# Patient Record
Sex: Female | Born: 1995 | Hispanic: Yes | Marital: Single | Smoking: Never smoker
Health system: Southern US, Community
[De-identification: ages and names within clinical notes are randomized; demographics above are authoritative.]

## PROBLEM LIST (undated history)

## (undated) ENCOUNTER — Inpatient Hospital Stay (HOSPITAL_COMMUNITY): Payer: Self-pay

## (undated) DIAGNOSIS — Z789 Other specified health status: Secondary | ICD-10-CM

## (undated) HISTORY — DX: Other specified health status: Z78.9

## (undated) HISTORY — PX: NO PAST SURGERIES: SHX2092

---

## 2016-03-23 ENCOUNTER — Encounter: Payer: Self-pay | Admitting: Family Medicine

## 2016-03-23 ENCOUNTER — Ambulatory Visit (INDEPENDENT_AMBULATORY_CARE_PROVIDER_SITE_OTHER): Payer: Medicaid Other | Admitting: *Deleted

## 2016-03-23 DIAGNOSIS — Z331 Pregnant state, incidental: Secondary | ICD-10-CM | POA: Diagnosis not present

## 2016-03-23 DIAGNOSIS — Z3490 Encounter for supervision of normal pregnancy, unspecified, unspecified trimester: Secondary | ICD-10-CM

## 2016-03-23 LAB — POCT PREGNANCY, URINE: PREG TEST UR: POSITIVE — AB

## 2016-03-23 LAB — HCG, QUANTITATIVE, PREGNANCY: HCG, BETA CHAIN, QUANT, S: 392.2 m[IU]/mL — AB

## 2016-03-23 NOTE — Progress Notes (Signed)
Here for pregnancy test which was positive. 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 States periods regular LMP 02/12/16 which makes her 5wk5d today with edc 11/19/15. Would like to get prenatal care here .  States she had unprotected intercourse 02/13/16 and took the morning after pill the same day. States since then has had intercourse once unprotected, and then with condoms.  States at home had a negative upt, then one week later positive upt.  Due to these issues discussed with Dr. Macon Large and  ordered a serum hcg today. She also reports she took a medicine from [place] before she knew she was pregnant but doesn't know the name- states it was for infection.  I instructed her to find out the name of the medicine and discuss at the first ob appt- additional testing might be needed. She voices understanding. Also discussed not to take any medicines without discussing with ob doctor first. She voices understanding.

## 2016-04-04 ENCOUNTER — Inpatient Hospital Stay (HOSPITAL_COMMUNITY): Payer: 59

## 2016-04-04 ENCOUNTER — Encounter: Payer: Self-pay | Admitting: Medical

## 2016-04-04 ENCOUNTER — Inpatient Hospital Stay (HOSPITAL_COMMUNITY)
Admission: AD | Admit: 2016-04-04 | Discharge: 2016-04-04 | Disposition: A | Discharge status: Home / Self Care | Payer: 59 | Source: Ambulatory Visit | Attending: Obstetrics and Gynecology | Admitting: Obstetrics and Gynecology

## 2016-04-04 DIAGNOSIS — B9689 Other specified bacterial agents as the cause of diseases classified elsewhere: Secondary | ICD-10-CM | POA: Diagnosis not present

## 2016-04-04 DIAGNOSIS — O039 Complete or unspecified spontaneous abortion without complication: Secondary | ICD-10-CM | POA: Diagnosis not present

## 2016-04-04 DIAGNOSIS — O035 Genital tract and pelvic infection following complete or unspecified spontaneous abortion: Secondary | ICD-10-CM | POA: Insufficient documentation

## 2016-04-04 DIAGNOSIS — Z6790 Unspecified blood type, Rh positive: Secondary | ICD-10-CM

## 2016-04-04 DIAGNOSIS — O4690 Antepartum hemorrhage, unspecified, unspecified trimester: Secondary | ICD-10-CM

## 2016-04-04 DIAGNOSIS — A499 Bacterial infection, unspecified: Secondary | ICD-10-CM

## 2016-04-04 DIAGNOSIS — N939 Abnormal uterine and vaginal bleeding, unspecified: Secondary | ICD-10-CM | POA: Diagnosis present

## 2016-04-04 DIAGNOSIS — N760 Acute vaginitis: Secondary | ICD-10-CM | POA: Insufficient documentation

## 2016-04-04 LAB — WET PREP, GENITAL
Sperm: NONE SEEN
TRICH WET PREP: NONE SEEN
YEAST WET PREP: NONE SEEN

## 2016-04-04 LAB — URINALYSIS, ROUTINE W REFLEX MICROSCOPIC
BILIRUBIN URINE: NEGATIVE
GLUCOSE, UA: NEGATIVE mg/dL
KETONES UR: NEGATIVE mg/dL
Nitrite: NEGATIVE
PROTEIN: 30 mg/dL — AB
Specific Gravity, Urine: 1.01 (ref 1.005–1.030)
pH: 8.5 — ABNORMAL HIGH (ref 5.0–8.0)

## 2016-04-04 LAB — CBC
HCT: 39.9 % (ref 36.0–46.0)
Hemoglobin: 13.7 g/dL (ref 12.0–15.0)
MCH: 29.1 pg (ref 26.0–34.0)
MCHC: 34.3 g/dL (ref 30.0–36.0)
MCV: 84.7 fL (ref 78.0–100.0)
PLATELETS: 237 10*3/uL (ref 150–400)
RBC: 4.71 MIL/uL (ref 3.87–5.11)
RDW: 12.4 % (ref 11.5–15.5)
WBC: 5.3 10*3/uL (ref 4.0–10.5)

## 2016-04-04 LAB — HCG, QUANTITATIVE, PREGNANCY: hCG, Beta Chain, Quant, S: 48 m[IU]/mL — ABNORMAL HIGH (ref <5)

## 2016-04-04 LAB — URINE MICROSCOPIC-ADD ON

## 2016-04-04 LAB — ABO/RH: ABO/RH(D): O POS

## 2016-04-04 MED ORDER — METRONIDAZOLE 500 MG PO TABS: 500.0000 mg | ORAL_TABLET | Freq: Two times a day (BID) | ORAL | 0 refills | Status: DC

## 2016-04-04 NOTE — MAU Provider Note (Signed)
History     CSN: [id number]  Arrival date and time: 04/04/16 1348   First Provider Initiated Contact with Patient 04/04/16 1500      Chief Complaint  Patient presents with  . Vaginal Bleeding   G1 @[redacted]w[redacted]d  by LMP c/o bleeding and cramping x3 days. She describes bleeding as spotting initially then became heavier and passed some golf ball sized clots. Today bleeding is heavy as menses. She took Ibuprofen for the cramping and achieved good relief. She was seen in another ED 3 days ago and told "the fetus was low". She reports having only blood work and no US. She reports white odorous vaginal discharge prior to the onset of bleeding. Quant 12 days ago was 392.   OB History    Gravida Para Term Preterm AB Living   1             SAB TAB Ectopic Multiple Live Births                  History reviewed. No pertinent past medical history.  History reviewed. No pertinent surgical history.  History reviewed. No pertinent family history.  Social History  Substance Use Topics  . Smoking status: Not on file  . Smokeless tobacco: Not on file  . Alcohol use Not on file    Allergies: Allergies not on file  No prescriptions prior to admission.    Review of Systems  Constitutional: Negative.   Genitourinary: Negative.   Neurological: Negative.    Physical Exam   Blood pressure 121/68, pulse 71, temperature 98.2 F (36.8 C), temperature source Oral, resp. rate 18, last menstrual period 02/12/2016.  Physical Exam  Constitutional: She is oriented to person, place, and time. She appears well-developed and well-nourished.  HENT:  Head: Normocephalic.  Neck: Normal range of motion.  Cardiovascular: Normal rate.   Respiratory: Effort normal.  GI: Soft. She exhibits no distension and no mass. There is no tenderness. There is no rebound and no guarding.  Genitourinary:  Genitourinary Comments: External: no lesions Vagina: rugated, nulli, moderate dark bloody discharge, no active  bleeding Uterus: non enlarged, anteverted, non tender, no CMT Adnexae: no masses, no tenderness left, no tenderness right   Musculoskeletal: Normal range of motion.  Neurological: She is alert and oriented to person, place, and time.  Skin: Skin is warm and dry.  Psychiatric: She has a normal mood and affect.   Results for orders placed or performed during the hospital encounter of 04/04/16 (from the past 24 hour(s))  Urinalysis, Routine w reflex microscopic (not [facility])     Status: Abnormal   Collection Time: 04/04/16  2:44 PM  Result Value Ref Range   Color, Urine RED (A) YELLOW   APPearance CLOUDY (A) CLEAR   Specific Gravity, Urine 1.010 1.005 - 1.030   pH 8.5 (H) 5.0 - 8.0   Glucose, UA NEGATIVE NEGATIVE mg/dL   Hgb urine dipstick LARGE (A) NEGATIVE   Bilirubin Urine NEGATIVE NEGATIVE   Ketones, ur NEGATIVE NEGATIVE mg/dL   Protein, ur 30 (A) NEGATIVE mg/dL   Nitrite NEGATIVE NEGATIVE   Leukocytes, UA SMALL (A) NEGATIVE  Urine microscopic-add on     Status: Abnormal   Collection Time: 04/04/16  2:44 PM  Result Value Ref Range   Squamous Epithelial / LPF 0-5 (A) NONE SEEN   WBC, UA 0-5 0 - 5 WBC/hpf   RBC / HPF 6-30 0 - 5 RBC/hpf   Bacteria, UA FEW (A) NONE SEEN  hCG,  quantitative, pregnancy     Status: Abnormal   Collection Time: 04/04/16  2:52 PM  Result Value Ref Range   hCG, Beta Chain, Quant, S 48 (H) <5 mIU/mL  CBC     Status: None   Collection Time: 04/04/16  2:52 PM  Result Value Ref Range   WBC 5.3 4.0 - 10.5 K/uL   RBC 4.71 3.87 - 5.11 MIL/uL   Hemoglobin 13.7 12.0 - 15.0 g/dL   HCT 81.1 91.4 - 78.2 %   MCV 84.7 78.0 - 100.0 fL   MCH 29.1 26.0 - 34.0 pg   MCHC 34.3 30.0 - 36.0 g/dL   RDW 95.6 21.3 - 08.6 %   Platelets 237 150 - 400 K/uL  ABO/Rh     Status: None   Collection Time: 04/04/16  2:52 PM  Result Value Ref Range   ABO/RH(D) O POS   Wet prep, genital     Status: Abnormal   Collection Time: 04/04/16  3:07 PM  Result Value Ref Range   Yeast  Wet Prep HPF POC NONE SEEN NONE SEEN   Trich, Wet Prep NONE SEEN NONE SEEN   Clue Cells Wet Prep HPF POC PRESENT (A) NONE SEEN   WBC, Wet Prep HPF POC FEW (A) NONE SEEN   Sperm NONE SEEN    US Ob Comp Less 14 Wks  Result Date: 04/04/2016 CLINICAL DATA:  Vaginal bleeding.  Beta HCG 48. EXAM: OBSTETRIC <14 WK [place] AND TRANSVAGINAL OB US TECHNIQUE: Both transabdominal and transvaginal ultrasound examinations were performed for complete evaluation of the gestation as well as the maternal uterus, adnexal regions, and pelvic cul-de-sac. Transvaginal technique was performed to assess early pregnancy. COMPARISON:  None. FINDINGS: Intrauterine gestational sac: None identified Yolk sac:  None Embryo:  None Maternal uterus/adnexae: Uterus itself appears normal. Endometrium measures 9 mm. Right ovary measures 1.2 x 2.4 x 2.4 cm and is normal. Left ovary measures 1.9 x 1.8 x 1.5 cm and is normal. Tiny amount of free fluid, within normal limits. No evidence of adnexal mass. IMPRESSION: No evidence of intrauterine or ectopic pregnancy at this time. In a patient with bleeding and positive beta HCG, this could represent an aborted early pregnancy. Follow-up recommended. Electronically Signed   By: Paulina Fusi M.D.   On: 04/04/2016 16:10   US Ob Transvaginal  Result Date: 04/04/2016 CLINICAL DATA:  Vaginal bleeding.  Beta HCG 48. EXAM: OBSTETRIC <14 WK [place] AND TRANSVAGINAL OB US TECHNIQUE: Both transabdominal and transvaginal ultrasound examinations were performed for complete evaluation of the gestation as well as the maternal uterus, adnexal regions, and pelvic cul-de-sac. Transvaginal technique was performed to assess early pregnancy. COMPARISON:  None. FINDINGS: Intrauterine gestational sac: None identified Yolk sac:  None Embryo:  None Maternal uterus/adnexae: Uterus itself appears normal. Endometrium measures 9 mm. Right ovary measures 1.2 x 2.4 x 2.4 cm and is normal. Left ovary measures 1.9 x 1.8 x 1.5 cm and is  normal. Tiny amount of free fluid, within normal limits. No evidence of adnexal mass. IMPRESSION: No evidence of intrauterine or ectopic pregnancy at this time. In a patient with bleeding and positive beta HCG, this could represent an aborted early pregnancy. Follow-up recommended. Electronically Signed   By: Paulina Fusi M.D.   On: 04/04/2016 16:10      MAU Course  Procedures  MDM Labs ordered and reviewed. Findings consistent with SAB and BV. Stable for discharge home.   Assessment and Plan   1. Spontaneous abortion  2. Vaginal bleeding in pregnancy   3. Bacterial vaginosis   4. Rh(D) positive    Discharge home Flagyl 500 mg po bid x7 days Bleeding precautions Abstain from IC until bleeding stops [facility] needs rpt quant and pt desires contraception  Donette LarryMelanie Hinton Luellen, CNM 04/04/2016, 3:09 PM

## 2016-04-04 NOTE — MAU Note (Signed)
Patient presents with c/o of vaginal bleeding and abdominal pain that started Friday.

## 2016-04-04 NOTE — Discharge Instructions (Signed)
Vaginosis bacteriana (Bacterial Vaginosis) La vaginosis bacteriana es una infeccin de la vagina. Se produce cuando una cantidad excesiva de ciertos grmenes (bacterias) crece en la vagina. Esta infeccin aumenta el riesgo de contraer otras infecciones de transmisin sexual. El tratamiento de esta infeccin puede ayudar a reducir el riesgo de otras infecciones, como:   Clamidia.  Bettey MareGonorrea.  VIH.  Herpes. CUIDADOS EN EL HOGAR  Tome los medicamentos tal como se lo indic su mdico.  Finalice la prescripcin completa, aunque comience a sentirse mejor.  Comunique a sus compaeros sexuales que sufre una infeccin. Deben consultar a su mdico para iniciar un tratamiento.  Durante el tratamiento:  Soil scientistvite mantener relaciones sexuales o use preservativos de Network engineerla forma correcta.  No se haga duchas vaginales.  No consuma alcohol a menos que el mdico lo autorice.  No amamante a menos que el mdico la autorice. SOLICITE AYUDA SI:  No mejora luego de 3 das de [place] Janet[place]atamiento.  Observa una secrecin (prdida) de color gris ms abundante que proviene de la vagina.  Siente ms dolor que antes.  Tiene fiebre. ASEGRESE DE QUE:   Comprende estas instrucciones.  Controlar su afeccin.  Recibir ayuda de inmediato si no mejora o si empeora.   Esta informacin no tiene Theme park managercomo fin reemplazar el consejo del mdico. Asegrese de hacerle al mdico cualquier pregunta que tenga.   Document Released: 11/04/2008 Document Revised: 08/29/2014 Elsevier Interactive Patient Education 2016 [employer]  Vaginosis bacteriana (Bacterial Vaginosis) La vaginosis bacteriana es una infeccin vaginal que perturba el equilibrio normal de las bacterias que se encuentran en la vagina. Es el resultado de un crecimiento excesivo de ciertas bacterias. Esta es la infeccin vaginal ms frecuente en mujeres en edad reproductiva. El tratamiento es importante para prevenir complicaciones, especialmente en mujeres  embarazadas, dado que puede causar un parto prematuro. CAUSAS  La vaginosis bacteriana se origina por un aumento de bacterias nocivas que, generalmente, estn presentes en cantidades ms pequeas en la vagina. Varios tipos diferentes de bacterias pueden causar esta afeccin. Sin embargo, la causa de su desarrollo no se comprende totalmente. FACTORES DE RIESGO Ciertas actividades o comportamientos pueden exponerlo a un mayor riesgo de desarrollar vaginosis bacteriana, entre los que se incluyen:  Tener una nueva pareja sexual o mltiples parejas sexuales.  Las duchas vaginales  El uso del DIU (dispositivo intrauterino) como mtodo anticonceptivo. El contagio no se produce en baos, por ropas de cama, en piscinas o por contacto con objetos. SIGNOS Y SNTOMAS  Algunas mujeres que padecen vaginosis bacteriana no presentan signos ni sntomas. Los sntomas ms comunes son:  Secrecin vaginal de color grisceo.  Secrecin vaginal con olor similar al Wal-Martpescado, especialmente despus de Sales promotion account executivemantener relaciones sexuales.  Picazn o sensacin de ardor en la vagina o la vulva.  Ardor o dolor al ConocoPhillipsorinar. DIAGNSTICO  Su mdico analizar su historia clnica y le examinar la vagina para detectar signos de vaginosis bacteriana. Puede tomarle Lauris Poaguna muestra de flujo vaginal. Su mdico examinar esta muestra con un microscopio para controlar las bacterias y clulas anormales. Tambin puede realizarse un anlisis del pH vaginal.  TRATAMIENTO  La vaginosis bacteriana puede tratarse con antibiticos, en forma de comprimidos o de crema vaginal. Puede indicarse una segunda tanda de antibiticos si la afeccin se repite despus del tratamiento. Debido a que la vaginosis bacteriana aumenta el riesgo de contraer enfermedades de transmisin sexual, el tratamiento puede ayudar a reducir el riesgo de clamidia, Gravitygonorrea, VIH y herpes. INSTRUCCIONES PARA EL CUIDADO EN EL HOGAR   Tome  solo medicamentos de venta libre o recetados,  segn las indicaciones del mdico.  Si le han recetado antibiticos, tmelos como se le indic. Asegrese de que finaliza la prescripcin completa aunque se sienta mejor.  Comunique a sus compaeros sexuales que sufre una infeccin vaginal. Deben consultar a su mdico y recibir tratamiento si tienen problemas, como picazn o una erupcin cutnea leve.  Durante el [place], es importante que siga estas indicaciones:  Fish farm managervite mantener relaciones sexuales o use preservativos de la forma correcta.  No se haga duchas vaginales.  Evite consumir alcohol como se lo haya indicado el mdico.  Psychologist, sport and exercisevite amamantar como se lo haya indicado el mdico. SOLICITE ATENCIN MDICA SI:   Sus sntomas no mejoran despus de 3 das de [place]tratamiento.  Aumenta la secrecin o Chief Technology Officerel dolor.  Tiene fiebre. ASEGRESE DE QUE:   Comprende estas instrucciones.  Controlar su afeccin.  Recibir ayuda de inmediato si no mejora o si empeora. PARA OBTENER MS INFORMACIN  Centros para el control y la prevencin de Child psychotherapistenfermedades (Centers for Disease Control and Prevention, CDC): SolutionApps.co.zawww.cdc.gov/std Asociacin Estadounidense de la Salud Sexual (American Sexual Health Association, SHA): www.ashastd.org    Esta informacin no tiene Theme park managercomo fin reemplazar el consejo del mdico. Asegrese de hacerle al mdico cualquier pregunta que tenga.   Document Released: 11/15/2007 Document Revised: 08/29/2014 Elsevier Interactive Patient Education 2016 [employer]vier [employer].  Aborto espontneo  (Miscarriage)  El aborto espontneo es la prdida de un beb que no ha nacido.(feto) antes de la semana 20 del embarazo. La causa generalmente es desconocida.  CUIDADOS EN EL HOGAR   Debe permanecer en cama (reposo en cama) o podr hacer actividades livianas. Regrese a sus actividades segn las indicaciones del mdico.  Pida ayuda con las tareas domsticas.  Anote cuntos apsitos [place]a por da. Describa el grado en que estn empapados.  No use tampones.  No se higienice la vagina (duchas vaginales) ni tenga relaciones sexuales (coito) hasta que el mdico la autorice.  Slo debe tomar la medicacin segn las indicaciones del mdico.  No tome aspirina.  Cumpla con los controles mdicos segn las indicaciones.  Si usted o su pareja tienen problemas con el duelo, hable con su mdico. Tambin puede intentar con psicoterapia. Permtase el tiempo suficiente de duelo antes de quedar embarazada nuevamente. SOLICITE AYUDA DE INMEDIATO SI:   Siente clicos intensos o dolor en el estmago, en la espalda o en el vientre (abdomen).  Tiene fiebre.  Elimina grumos de sangre (cogulos) por la vagina, que tienen el tamao de una nuez o ms. Guarde los cogulos para que el Qwest Communicationsmdico los vea.  Elimina gran cantidad de tejidos por la vagina. Guarde lo que ha eliminado para que su mdico lo examine.  Aumenta el sangrado.  Observa una secrecin espesa, con mal olor (prdida) que proviene de la vagina.  Se siente mareada, dbil o se desvanece (se desmaya).  Siente escalofros. ASEGRESE DE QUE:   Comprende estas instrucciones.  Controlar su enfermedad.  Solicitar ayuda de inmediato si no mejora o si empeora.   Esta informacin no tiene Theme park managercomo fin reemplazar el consejo del mdico. Asegrese de hacerle al mdico cualquier pregunta que tenga.   Document Released: 02/07/2012 Elsevier Interactive Patient Education [employer]2016 Elsevier Inc.

## 2016-04-05 LAB — GC/CHLAMYDIA PROBE AMP (~~LOC~~) NOT AT ARMC
Chlamydia: NEGATIVE
Neisseria Gonorrhea: NEGATIVE

## 2016-04-21 ENCOUNTER — Ambulatory Visit (INDEPENDENT_AMBULATORY_CARE_PROVIDER_SITE_OTHER): Payer: Medicaid Other | Admitting: Family Medicine

## 2016-04-21 ENCOUNTER — Encounter: Payer: Self-pay | Admitting: Family Medicine

## 2016-04-21 ENCOUNTER — Encounter: Payer: Self-pay | Admitting: General Practice

## 2016-04-21 VITALS — BP 117/69 | HR 79 | Ht 66.0 in | Wt 173.0 lb

## 2016-04-21 DIAGNOSIS — Z30017 Encounter for initial prescription of implantable subdermal contraceptive: Secondary | ICD-10-CM

## 2016-04-21 MED ORDER — ETONOGESTREL 68 MG ~~LOC~~ IMPL
68.0000 mg | DRUG_IMPLANT | Freq: Once | SUBCUTANEOUS | Status: AC
  Administered 2016-04-21: 68 mg via SUBCUTANEOUS

## 2016-04-21 NOTE — Progress Notes (Signed)
Nexplanon Insertion:  Patient given informed consent, signed copy in the chart, time out was performed. Pregnancy test not done as pt had recent miscarriage.  No unprotected sex. Appropriate time out taken.  Patient's left arm was prepped and draped in the usual sterile fashion.. The ruler used to measure and mark insertion area.  Pt was prepped with alcohol swab and then injected with 3 cc of 1% lidocaine with epinephrine.  Pt was prepped with betadine, Implanon removed form packaging,  Device confirmed in needle, then inserted full length of needle and withdrawn per handbook instructions.  Device palpated by physician and patient.  Pt insertion site covered with pressure dressing.   Minimal blood loss.  Pt tolerated the procedure well.

## 2016-04-21 NOTE — Addendum Note (Signed)
Addended by: Sherre LainASH, AMANDA A on: 04/21/2016 03:22 PM   Modules accepted: Orders

## 2016-04-21 NOTE — Patient Instructions (Signed)
Etonogestrel implant What is this medicine? ETONOGESTREL (et oh noe JES trel) is a contraceptive (birth control) device. It is used to prevent pregnancy. It can be used for up to 3 years. This medicine may be used for other purposes; ask your health care provider or pharmacist if you have questions. What should I tell my health care provider before I take this medicine? They need to know if you have any of these conditions: -abnormal vaginal bleeding -blood vessel disease or blood clots -cancer of the breast, cervix, or liver -depression -diabetes -gallbladder disease -headaches -heart disease or recent heart attack -high blood pressure -high cholesterol -kidney disease -liver disease -renal disease -seizures -tobacco smoker -an unusual or allergic reaction to etonogestrel, other hormones, anesthetics or antiseptics, medicines, foods, dyes, or preservatives -pregnant or trying to get pregnant -breast-feeding How should I use this medicine? This device is inserted just under the skin on the inner side of your upper arm by a health care professional. Talk to your pediatrician regarding the use of this medicine in children. Special care may be needed. Overdosage: If you think you have taken too much of this medicine contact a poison control center or emergency room at once. NOTE: This medicine is only for you. Do not share this medicine with others. What if I miss a dose? This does not apply. What may interact with this medicine? Do not take this medicine with any of the following medications: -amprenavir -bosentan -fosamprenavir This medicine may also interact with the following medications: -barbiturate medicines for inducing sleep or treating seizures -certain medicines for fungal infections like ketoconazole and itraconazole -griseofulvin -medicines to treat seizures like carbamazepine, felbamate, oxcarbazepine, phenytoin,  topiramate -modafinil -phenylbutazone -rifampin -some medicines to treat HIV infection like atazanavir, indinavir, lopinavir, nelfinavir, tipranavir, ritonavir -St. John's wort This list may not describe all possible interactions. Give your health care provider a list of all the medicines, herbs, non-prescription drugs, or dietary supplements you use. Also tell them if you smoke, drink alcohol, or use illegal drugs. Some items may interact with your medicine. What should I watch for while using this medicine? This product does not protect you against HIV infection (AIDS) or other sexually transmitted diseases. You should be able to feel the implant by pressing your fingertips over the skin where it was inserted. Contact your doctor if you cannot feel the implant, and use a non-hormonal birth control method (such as condoms) until your doctor confirms that the implant is in place. If you feel that the implant may have broken or become bent while in your arm, contact your healthcare provider. What side effects may I notice from receiving this medicine? Side effects that you should report to your doctor or health care professional as soon as possible: -allergic reactions like skin rash, itching or hives, swelling of the face, lips, or tongue -breast lumps -changes in emotions or moods -depressed mood -heavy or prolonged menstrual bleeding -pain, irritation, swelling, or bruising at the insertion site -scar at site of insertion -signs of infection at the insertion site such as fever, and skin redness, pain or discharge -signs of pregnancy -signs and symptoms of a blood clot such as breathing problems; changes in vision; chest pain; severe, sudden headache; pain, swelling, warmth in the leg; trouble speaking; sudden numbness or weakness of the face, arm or leg -signs and symptoms of liver injury like dark yellow or brown urine; general ill feeling or flu-like symptoms; light-colored stools; loss of  appetite; nausea; right upper belly   pain; unusually weak or tired; yellowing of the eyes or skin -unusual vaginal bleeding, discharge -signs and symptoms of a stroke like changes in vision; confusion; trouble speaking or understanding; severe headaches; sudden numbness or weakness of the face, arm or leg; trouble walking; dizziness; loss of balance or coordination Side effects that usually do not require medical attention (Report these to your doctor or health care professional if they continue or are bothersome.): -acne -back pain -breast pain -changes in weight -dizziness -general ill feeling or flu-like symptoms -headache -irregular menstrual bleeding -nausea -sore throat -vaginal irritation or inflammation This list may not describe all possible side effects. Call your doctor for medical advice about side effects. You may report side effects to FDA at 1-800-FDA-1088. Where should I keep my medicine? This drug is given in a hospital or clinic and will not be stored at home. NOTE: This sheet is a summary. It may not cover all possible information. If you have questions about this medicine, talk to your doctor, pharmacist, or health care provider.    2016, Elsevier/Gold Standard. (2014-05-23 14:07:06)  

## 2016-11-24 ENCOUNTER — Ambulatory Visit: Payer: 59 | Admitting: Family Medicine

## 2017-01-05 ENCOUNTER — Ambulatory Visit: Payer: 59 | Admitting: Family Medicine

## 2017-06-12 ENCOUNTER — Ambulatory Visit (INDEPENDENT_AMBULATORY_CARE_PROVIDER_SITE_OTHER): Payer: Self-pay

## 2017-06-12 VITALS — BP 121/82 | HR 85

## 2017-06-12 DIAGNOSIS — Z3202 Encounter for pregnancy test, result negative: Secondary | ICD-10-CM

## 2017-06-12 LAB — POCT PREGNANCY, URINE: PREG TEST UR: NEGATIVE

## 2017-06-12 NOTE — Progress Notes (Signed)
Patient presented to the office for a UPT. UPT was negative. Patient stated that she took a pregnancy test at home and it was positive. Patient stated that she has been felling dizzy and vomited a few times. Patient is scheduled to have her nexplanon removed on 11/27. Patient stated that she has gained 30lbs since last year.

## 2017-07-18 ENCOUNTER — Ambulatory Visit: Payer: 59

## 2017-08-02 ENCOUNTER — Ambulatory Visit: Payer: 59 | Admitting: Medical

## 2017-08-22 NOTE — L&D Delivery Note (Addendum)
OB/GYN Faculty Practice Delivery Note  Melissa Foster is a 22 y.o. G2P0010 s/p SVD at 7888w3d. She was admitted for SOL.   ROM: 2h 6753m with clear fluid GBS Status: negative Maximum Maternal Temperature: 98.7 F  Labor Progress: . Augmentation with AROM  Delivery Date/Time: 2218 Delivery: Called to room and patient was complete and pushing. Head delivered ROA. No nuchal cord present. Shoulder and body delivered in usual fashion. Infant with spontaneous cry, placed on mother's abdomen, dried and stimulated. Cord clamped x 2 after 1-minute delay, and cut by sister-in-law. Cord blood drawn. Placenta delivered spontaneously with gentle cord traction. Fundus firm with massage and Pitocin. Labia, perineum, vagina, and cervix inspected inspected with hemostatic bilateral periurethral which did not require repair. A 2nd degree laceration was also noted and repaired with 3.0 Vicryl in the usual fashion and hemostatic afterwards. 20ml of 1% lidocaine was used for anesthesia.   Placenta: intact Complications: none Lacerations: bilateral periurethral and 2nd degree EBL: 200ml  Infant: vigorous female  APGARs 8 and 9  weight pending   Burman NievesJulia Abraham, MD Family Medicine Resident    OB FELLOW DELIVERY ATTESTATION  I was gloved and present for the delivery in its entirety, and I agree with the above resident's note.    Marcy Sirenatherine Wallace, D.O. OB Fellow  07/04/2018, 3:24 AM

## 2017-09-06 ENCOUNTER — Ambulatory Visit: Payer: Self-pay

## 2017-11-14 LAB — OB RESULTS CONSOLE GC/CHLAMYDIA
Chlamydia: NEGATIVE
GC PROBE AMP, GENITAL: NEGATIVE

## 2017-11-14 LAB — OB RESULTS CONSOLE HGB/HCT, BLOOD
HEMATOCRIT: 44
HEMOGLOBIN: 15.2

## 2017-11-14 LAB — OB RESULTS CONSOLE PLATELET COUNT: Platelets: 255

## 2017-11-14 LAB — OB RESULTS CONSOLE ABO/RH: RH Type: POSITIVE

## 2017-11-14 LAB — OB RESULTS CONSOLE RUBELLA ANTIBODY, IGM: RUBELLA: IMMUNE

## 2017-11-14 LAB — OB RESULTS CONSOLE RPR: RPR: NONREACTIVE

## 2017-11-14 LAB — OB RESULTS CONSOLE VARICELLA ZOSTER ANTIBODY, IGG: Varicella: IMMUNE

## 2017-11-14 LAB — URINE CULTURE

## 2017-11-14 LAB — OB RESULTS CONSOLE HEPATITIS B SURFACE ANTIGEN: HEP B S AG: NEGATIVE

## 2017-11-14 LAB — OB RESULTS CONSOLE ANTIBODY SCREEN: Antibody Screen: NEGATIVE

## 2017-11-14 LAB — OB RESULTS CONSOLE HIV ANTIBODY (ROUTINE TESTING): HIV: NONREACTIVE

## 2018-01-18 ENCOUNTER — Encounter: Payer: Self-pay | Admitting: *Deleted

## 2018-01-19 ENCOUNTER — Encounter: Payer: Self-pay | Admitting: *Deleted

## 2018-01-22 ENCOUNTER — Ambulatory Visit (INDEPENDENT_AMBULATORY_CARE_PROVIDER_SITE_OTHER): Payer: Self-pay

## 2018-01-22 VITALS — BP 117/71 | HR 81 | Wt 208.9 lb

## 2018-01-22 DIAGNOSIS — Z3400 Encounter for supervision of normal first pregnancy, unspecified trimester: Secondary | ICD-10-CM | POA: Insufficient documentation

## 2018-01-22 DIAGNOSIS — Z3402 Encounter for supervision of normal first pregnancy, second trimester: Secondary | ICD-10-CM

## 2018-01-22 LAB — POCT URINALYSIS DIP (DEVICE)
BILIRUBIN URINE: NEGATIVE
Glucose, UA: NEGATIVE mg/dL
HGB URINE DIPSTICK: NEGATIVE
Ketones, ur: NEGATIVE mg/dL
Nitrite: NEGATIVE
PH: 6.5 (ref 5.0–8.0)
Protein, ur: NEGATIVE mg/dL
SPECIFIC GRAVITY, URINE: 1.015 (ref 1.005–1.030)
Urobilinogen, UA: 0.2 mg/dL (ref 0.0–1.0)

## 2018-01-22 NOTE — Progress Notes (Signed)
     Subjective:   Melissa Foster is a 22 y.o. G2P0010 at 504w0d by LMP being seen today for her first obstetrical visit.  Her obstetrical history is significant for obesity and has Supervision of normal first pregnancy, antepartum on their problem list.. Patient does intend to breast feed. Pregnancy history fully reviewed.  Patient reports no complaints.  HISTORY: OB History  Gravida Para Term Preterm AB Living  2 0 0 0 1 0  SAB TAB Ectopic Multiple Live Births  1 0 0 0 0    # Outcome Date GA Lbr Len/2nd Weight Sex Delivery Anes PTL Lv  2 Current           1 SAB 03/2016           Past Medical History:  Diagnosis Date  . Medical history non-contributory    Past Surgical History:  Procedure Laterality Date  . NO PAST SURGERIES     History reviewed. No pertinent family history. Social History   Tobacco Use  . Smoking status: Never Smoker  . Smokeless tobacco: Never Used  Substance Use Topics  . Alcohol use: Not Currently    Comment: occasionally  . Drug use: No   No Known Allergies Current Outpatient Medications on File Prior to Visit  Medication Sig Dispense Refill  . Prenatal Vit-Fe Fumarate-FA (PRENATAL MULTIVITAMIN) TABS tablet Take 1 tablet by mouth daily at 12 noon.     No current facility-administered medications on file prior to visit.    Exam   Vitals:   01/22/18 1825  BP: 117/71  Pulse: 81  Weight: 208 lb 14.4 oz (94.8 kg)   Fetal Heart Rate (bpm): 147  Uterus:  Fundal Height: 18 cm  Pelvic Exam: Perineum:  not examined   Vulva:    Vagina:     Cervix:    Adnexa:    Bony Pelvis:   System: General: well-developed, well-nourished female in no acute distress   Breast:  normal appearance, no masses or tenderness   Skin: normal coloration and turgor, no rashes   Neurologic: oriented, normal, negative, normal mood   Extremities: normal strength, tone, and muscle mass, ROM of all joints is normal   HEENT PERRLA, extraocular movement intact  and sclera clear, anicteric   Mouth/Teeth mucous membranes moist, pharynx normal without lesions and dental hygiene good   Neck supple and no masses   Cardiovascular: regular rate and rhythm   Respiratory:  no respiratory distress, normal breath sounds   Abdomen: soft, non-tender; bowel sounds normal; no masses,  no organomegaly    Assessment:   Pregnancy: G2P0010 Patient Active Problem List   Diagnosis Date Noted  . Supervision of normal first pregnancy, antepartum 01/22/2018     Plan:  1. Supervision of normal first pregnancy, antepartum -No complaints.  Initial labs drawn at previous clinic. Results in media. Continue prenatal vitamins. Genetic Screening discussed, First trimester screen, Quad screen and NIPS: done at previous clinic. Will request results. Ultrasound discussed; fetal anatomic survey: ordered. Problem list reviewed and updated. The nature of [facility] with multiple MDs and other Advanced Practice Providers was explained to patient; also emphasized that residents, students are part of our team. Routine obstetric precautions reviewed. Return in about 1 month (around 02/19/2018) for Return OB visit.  Rolm BookbinderCaroline M Melissa Foster, CNM 01/22/18 7:03 PM

## 2018-01-22 NOTE — Patient Instructions (Signed)

## 2018-01-23 ENCOUNTER — Telehealth: Payer: Self-pay | Admitting: *Deleted

## 2018-01-23 NOTE — Telephone Encounter (Signed)
Called pt and informed her of US appt in MFM dept on 6/10 @ 1445 and need to arrive @ 1430. Pt voiced understanding and agreed to appt as scheduled.

## 2018-01-29 ENCOUNTER — Ambulatory Visit (HOSPITAL_COMMUNITY)
Admission: RE | Admit: 2018-01-29 | Discharge: 2018-01-29 | Disposition: A | Discharge status: Home / Self Care | Payer: Medicaid Other | Source: Ambulatory Visit

## 2018-01-29 ENCOUNTER — Other Ambulatory Visit: Payer: Self-pay

## 2018-01-29 DIAGNOSIS — Z363 Encounter for antenatal screening for malformations: Secondary | ICD-10-CM

## 2018-01-29 DIAGNOSIS — Z3400 Encounter for supervision of normal first pregnancy, unspecified trimester: Secondary | ICD-10-CM

## 2018-01-29 DIAGNOSIS — O99212 Obesity complicating pregnancy, second trimester: Secondary | ICD-10-CM | POA: Insufficient documentation

## 2018-01-29 DIAGNOSIS — Z3A17 17 weeks gestation of pregnancy: Secondary | ICD-10-CM | POA: Insufficient documentation

## 2018-01-29 DIAGNOSIS — O321XX0 Maternal care for breech presentation, not applicable or unspecified: Secondary | ICD-10-CM | POA: Diagnosis not present

## 2018-01-29 DIAGNOSIS — E669 Obesity, unspecified: Secondary | ICD-10-CM | POA: Diagnosis not present

## 2018-02-20 ENCOUNTER — Ambulatory Visit (INDEPENDENT_AMBULATORY_CARE_PROVIDER_SITE_OTHER): Payer: Medicaid Other | Admitting: Obstetrics and Gynecology

## 2018-02-20 VITALS — BP 121/61 | HR 85 | Wt 216.8 lb

## 2018-02-20 DIAGNOSIS — Z3400 Encounter for supervision of normal first pregnancy, unspecified trimester: Secondary | ICD-10-CM

## 2018-02-20 DIAGNOSIS — O99212 Obesity complicating pregnancy, second trimester: Secondary | ICD-10-CM

## 2018-02-20 MED ORDER — PRENATAL VITAMINS 0.8 MG PO TABS: 1.0000 | ORAL_TABLET | Freq: Every day | ORAL | 12 refills | Status: DC

## 2018-02-20 NOTE — Progress Notes (Addendum)
Subjective:  Melissa Foster is a 22 y.o. G2P0010 at 5075w3d being seen today for ongoing prenatal care.  She is currently monitored for the following issues for this low-risk pregnancy and has Supervision of normal first pregnancy, antepartum; Encounter for antenatal screening for malformation; Obesity affecting pregnancy in second trimester; and [redacted] weeks gestation of pregnancy on their problem list.   Patient reports no complaints.  Contractions: Not present. Vag. Bleeding: None.  Movement: Present. Denies leaking of fluid. The current gestational age was determined to be around 20 weeks setting her due date at 07/07/2018.   The following portions of the patient's history were reviewed and updated as appropriate: allergies, current medications, past family history, past medical history, past social history, past surgical history and problem list. Problem list updated.  Objective:   Vitals:   02/20/18 1553  BP: 121/61  Pulse: 85  Weight: 216 lb 12.8 oz (98.3 kg)    Fetal Status: Fetal Heart Rate (bpm): 146 Fundal Height: 20 cm Movement: Present     General:  Alert, oriented and cooperative. Patient is in no acute distress.  Skin: Skin is warm and dry. No rash noted.   Cardiovascular: Normal heart rate noted  Respiratory: Normal respiratory effort, no problems with respiration noted  Abdomen: Soft, gravid, appropriate for gestational age. Pain/Pressure: Absent     Pelvic: Vag. Bleeding: None          Extremities: Normal range of motion.  Edema: None  Mental Status: Normal mood and affect. Normal behavior. Normal judgment and thought content.   Urinalysis:      Assessment and Plan:  Pregnancy: G2P0010 at 4975w3d  1. Supervision of normal first pregnancy, antepartum Patient elected to receive Genetic Screening. EDD changed based off anatomy US. LMP and US dating off by >10d. New dating appropriate for FH.   2. Obesity affecting pregnancy in second trimester Patient is  encouraged to exercise 30 minutes a day and ensure she is introducing fruits, vegetables, and whole grains into her diet.  General obstetric precautions including but not limited to vaginal bleeding, contractions, leaking of fluid were reviewed in detail with the patient. Please refer to After Visit Summary for other counseling recommendations.  Return in about 1 month (around 03/20/2018) for ob visit.   Candie MileMitchell, Finley Dinkel R, Student-PA   NOTE ATTESTATION  I confirm that I have verified the information documented in the medical student's note and that I have also personally reperformed the physical exam and all medical decision making activities. Additionally I have made edits above.   Caryl AdaJazma Phelps, DO 02/20/2018, 6:33 PM

## 2018-02-20 NOTE — Patient Instructions (Signed)
Second Trimester of Pregnancy The second trimester is from week 13 through week 28, month 4 through 6. This is often the time in pregnancy that you feel your best. Often times, morning sickness has lessened or quit. You may have more energy, and you may get hungry more often. Your unborn baby (fetus) is growing rapidly. At the end of the sixth month, he or she is about 9 inches long and weighs about 1 pounds. You will likely feel the baby move (quickening) between 18 and 20 weeks of pregnancy. Follow these instructions at home:  Avoid all smoking, herbs, and alcohol. Avoid drugs not approved by your doctor.  Do not use any tobacco products, including cigarettes, chewing tobacco, and electronic cigarettes. If you need help quitting, ask your doctor. You may get counseling or other support to help you quit.  Only take medicine as told by your doctor. Some medicines are safe and some are not during pregnancy.  Exercise only as told by your doctor. Stop exercising if you start having cramps.  Eat regular, healthy meals.  Wear a good support bra if your breasts are tender.  Do not use hot tubs, steam rooms, or saunas.  Wear your seat belt when driving.  Avoid raw meat, uncooked cheese, and liter boxes and soil used by cats.  Take your prenatal vitamins.  Take 1500-2000 milligrams of calcium daily starting at the 20th week of pregnancy until you deliver your baby.  Try taking medicine that helps you poop (stool softener) as needed, and if your doctor approves. Eat more fiber by eating fresh fruit, vegetables, and whole grains. Drink enough fluids to keep your pee (urine) clear or pale yellow.  Take warm water baths (sitz baths) to soothe pain or discomfort caused by hemorrhoids. Use hemorrhoid cream if your doctor approves.  If you have puffy, bulging veins (varicose veins), wear support hose. Raise (elevate) your feet for 15 minutes, 3-4 times a day. Limit salt in your diet.  Avoid heavy  lifting, wear low heals, and sit up straight.  Rest with your legs raised if you have leg cramps or low back pain.  Visit your dentist if you have not gone during your pregnancy. Use a soft toothbrush to brush your teeth. Be gentle when you floss.  You can have sex (intercourse) unless your doctor tells you not to.  Go to your doctor visits. Get help if:  You feel dizzy.  You have mild cramps or pressure in your lower belly (abdomen).  You have a nagging pain in your belly area.  You continue to feel sick to your stomach (nauseous), throw up (vomit), or have watery poop (diarrhea).  You have bad smelling fluid coming from your vagina.  You have pain with peeing (urination). Get help right away if:  You have a fever.  You are leaking fluid from your vagina.  You have spotting or bleeding from your vagina.  You have severe belly cramping or pain.  You lose or gain weight rapidly.  You have trouble catching your breath and have chest pain.  You notice sudden or extreme puffiness (swelling) of your face, hands, ankles, feet, or legs.  You have not felt the baby move in over an hour.  You have severe headaches that do not go away with medicine.  You have vision changes. This information is not intended to replace advice given to you by your health care provider. Make sure you discuss any questions you have with your health care   provider. Document Released: 11/02/2009 Document Revised: 01/14/2016 Document Reviewed: 10/09/2012 Elsevier Interactive Patient Education  2017 Elsevier Inc.  

## 2018-02-26 ENCOUNTER — Encounter: Payer: Self-pay | Admitting: *Deleted

## 2018-03-06 ENCOUNTER — Telehealth: Payer: Self-pay

## 2018-03-06 NOTE — Telephone Encounter (Signed)
Patient prenatal vitamins were [place] [facility] [place]

## 2018-03-06 NOTE — Telephone Encounter (Signed)
-----   Message from Marti SleighSctaria J Malloy, [place] sent at 03/06/2018  2:47 PM EDT ----- Hi ladies:  This patient said that the pharmacy did not receive prescription for prenatal multivitamin.

## 2018-03-07 ENCOUNTER — Encounter: Payer: Self-pay | Admitting: *Deleted

## 2018-03-22 ENCOUNTER — Ambulatory Visit (INDEPENDENT_AMBULATORY_CARE_PROVIDER_SITE_OTHER): Payer: Medicaid Other | Admitting: Advanced Practice Midwife

## 2018-03-22 VITALS — BP 123/62 | HR 74 | Wt 221.0 lb

## 2018-03-22 DIAGNOSIS — Z3402 Encounter for supervision of normal first pregnancy, second trimester: Secondary | ICD-10-CM

## 2018-03-22 DIAGNOSIS — Z3400 Encounter for supervision of normal first pregnancy, unspecified trimester: Secondary | ICD-10-CM

## 2018-03-22 NOTE — Progress Notes (Signed)
   PRENATAL VISIT NOTE  Subjective:  Melissa Foster is a 22 y.o. G2P0010 at 2753w5d being seen today for ongoing prenatal care.  She is currently monitored for the following issues for this low-risk pregnancy and has Supervision of normal first pregnancy, antepartum; Encounter for antenatal screening for malformation; Obesity affecting pregnancy in second trimester; and [redacted] weeks gestation of pregnancy on their problem list.  Patient reports no complaints.  Contractions: Not present. Vag. Bleeding: None.  Movement: Present. Denies leaking of fluid.   The following portions of the patient's history were reviewed and updated as appropriate: allergies, current medications, past family history, past medical history, past social history, past surgical history and problem list. Problem list updated.  Objective:   Vitals:   03/22/18 1429  BP: 123/62  Pulse: 74  Weight: 221 lb (100.2 kg)    Fetal Status: Fetal Heart Rate (bpm): 142   Movement: Present     General:  Alert, oriented and cooperative. Patient is in no acute distress.  Skin: Skin is warm and dry. No rash noted.   Cardiovascular: Normal heart rate noted  Respiratory: Normal respiratory effort, no problems with respiration noted  Abdomen: Soft, gravid, appropriate for gestational age.  Pain/Pressure: Absent     Pelvic: Cervical exam deferred        Extremities: Normal range of motion.  Edema: None  Mental Status: Normal mood and affect. Normal behavior. Normal judgment and thought content.   Assessment and Plan:  Pregnancy: G2P0010 at 2553w5d  1. Supervision of normal first pregnancy, antepartum --Anticipatory guidance about next visits/weeks of pregnancy given. --FH 26-26.5 at 6453w5d.  Recheck at next visit consider EFW by US if needed.  Preterm labor symptoms and general obstetric precautions including but not limited to vaginal bleeding, contractions, leaking of fluid and fetal movement were reviewed in detail with the  patient. Please refer to After Visit Summary for other counseling recommendations.  Return in about 1 month (around 04/19/2018).  No future appointments.  Sharen CounterLisa Leftwich-Kirby, CNM

## 2018-03-22 NOTE — Patient Instructions (Signed)

## 2018-04-18 ENCOUNTER — Other Ambulatory Visit: Payer: Self-pay

## 2018-04-18 DIAGNOSIS — Z3400 Encounter for supervision of normal first pregnancy, unspecified trimester: Secondary | ICD-10-CM

## 2018-04-20 ENCOUNTER — Ambulatory Visit (INDEPENDENT_AMBULATORY_CARE_PROVIDER_SITE_OTHER): Payer: Medicaid Other | Admitting: Obstetrics and Gynecology

## 2018-04-20 ENCOUNTER — Other Ambulatory Visit: Payer: Medicaid Other

## 2018-04-20 VITALS — BP 125/58 | HR 77 | Wt 222.8 lb

## 2018-04-20 DIAGNOSIS — Z363 Encounter for antenatal screening for malformations: Secondary | ICD-10-CM

## 2018-04-20 DIAGNOSIS — Z23 Encounter for immunization: Secondary | ICD-10-CM | POA: Diagnosis not present

## 2018-04-20 DIAGNOSIS — Z3483 Encounter for supervision of other normal pregnancy, third trimester: Secondary | ICD-10-CM

## 2018-04-20 DIAGNOSIS — Z3400 Encounter for supervision of normal first pregnancy, unspecified trimester: Secondary | ICD-10-CM

## 2018-04-20 NOTE — Patient Instructions (Addendum)
Contraception Choices Contraception, also called birth control, means things to use or ways to try not to get pregnant. Hormonal birth control This kind of birth control uses hormones. Here are some types of hormonal birth control:  A tube that is put under skin of the arm (implant). The tube can stay in for as long as 3 years.  Shots to get every 3 months (injections).  Pills to take every day (birth control pills).  A patch to change 1 time each week for 3 weeks (birth control patch). After that, the patch is taken off for 1 week.  A ring to put in the vagina. The ring is left in for 3 weeks. Then it is taken out of the vagina for 1 week. Then a new ring is put in.  Pills to take after unprotected sex (emergency birth control pills).  Barrier birth control Here are some types of barrier birth control:  A thin covering that is put on the penis before sex (female condom). The covering is thrown away after sex.  A soft, loose covering that is put in the vagina before sex (female condom). The covering is thrown away after sex.  A rubber bowl that sits over the cervix (diaphragm). The bowl must be made for you. The bowl is put into the vagina before sex. The bowl is left in for 6-8 hours after sex. It is taken out within 24 hours.  A small, soft cup that fits over the cervix (cervical cap). The cup must be made for you. The cup can be left in for 6-8 hours after sex. It is taken out within 48 hours.  A sponge that is put into the vagina before sex. It must be left in for at least 6 hours after sex. It must be taken out within 30 hours. Then it is thrown away.  A chemical that kills or stops sperm from getting into the uterus (spermicide). It may be a pill, cream, jelly, or foam to put in the vagina. The chemical should be used at least 10-15 minutes before sex.  IUD (intrauterine) birth control An IUD is a small, T-shaped piece of plastic. It is put inside the uterus. There are two  kinds:  Hormone IUD. This kind can stay in for 3-5 years.  Copper IUD. This kind can stay in for 10 years.  Permanent birth control Here are some types of permanent birth control:  Surgery to block the fallopian tubes.  Having an insert put into each fallopian tube.  Surgery to tie off the tubes that carry sperm (vasectomy).  Natural planning birth control Here are some types of natural planning birth control:  Not having sex on the days the woman could get pregnant.  Using a calendar: ? To keep track of the length of each period. ? To find out what days pregnancy can happen. ? To plan to not have sex on days when pregnancy can happen.  Watching for symptoms of ovulation and not having sex during ovulation. One way the woman can check for ovulation is to check her temperature.  Waiting to have sex until after ovulation.  Summary  Contraception, also called birth control, means things to use or ways to try not to get pregnant.  Hormonal methods of birth control include implants, injections, pills, patches, vaginal rings, and emergency birth control pills.  Barrier methods of birth control can include female condoms, female condoms, diaphragms, cervical caps, sponges, and spermicides.  There are two types of   IUD (intrauterine device) birth control. An IUD can be put in a woman's uterus to prevent pregnancy for 3-5 years.  Permanent sterilization can be done through a procedure for males, females, or both.  Natural planning methods involve not having sex on the days when the woman could get pregnant. This information is not intended to replace advice given to you by your health care provider. Make sure you discuss any questions you have with your health care provider. Document Released: 06/05/2009 Document Revised: 08/18/2016 Document Reviewed: 08/18/2016 Elsevier Interactive Patient Education  2017 Elsevier [employer]   AREA PEDIATRIC/FAMILY PRACTICE PHYSICIANS  [facility] [address] [place], [place]  [address] Phone - [phone]   Fax - [phone]  [facility] [address] [place], [place] [address] Phone - [phone]   Fax - [phone]  [facility] [address] [place], [place]  [address] Phone - [phone]   Fax - [phone]  [facility] [address], [address] [place], [place]  [address] Phone - [phone]   Fax - [phone]  [facility] [address] [place], [place]  [address] Phone - [phone]   Fax - [phone]  [facility] [address], [address] [place], [place]  [address] Phone - [phone]   Fax - [phone]  [facility] [place] [address], [address] [place], [place]  [address] Phone - [phone]   Fax - [phone]  [facility] [address] [place], [place]  [address] Phone - [phone]   Fax - [phone]  [facility] [address] [place], [place]  [address] Phone - [phone]   Fax - [phone] [facility] [place] [address] [place], [place]  [address] Phone - [phone]   Fax - [phone]  [facility] [address] [place], [place]  [address] Phone - [phone]   Fax - [phone]  [facility] [address], [address] [place], [place]  [address] Phone - [phone]   Fax - [phone]  [facility] [place] [address] [place], [place]  [address] Phone - [phone]   Fax - [phone]  [facility] [address], [address] [place] [place], [place]  [address] Phone - [phone]  [facility] [address] [place], [place]  [address] Phone - [phone]   Fax - [phone]  [facility] [address], [address] [place], [place]  [address] Phone - [phone]    Fax - [phone]  [facility] [address] [place], [place]  [address] Phone - [phone]   Fax - [phone]  [facility] [address] [place], [place]  [address] Phone - [phone]   Fax - [phone]   [facility] [address], [address] [place], [place]  [address] Phone - [phone]   Fax - [phone]  [facility] [address], [address] [place], [place]  [address] Phone - [phone]   Fax - [phone]  [facility] RUBIN [address], [address] [place], [place]  [address] Phone - [phone]   Fax - [phone]  [facility] [address] [place], [place]  [address] Phone - [phone]   Fax - [phone]  [facility] [address] [place], [place]  [address] Phone - [phone]   Fax - [phone] [facility]  [place] [address] [place], [place]  [address] Phone - [phone]   Fax - [phone]  [facility] [address] [place], [place]  [address] Phone - [phone]   Fax - [phone]  [facility] [place] [address] [place], [place]  [address] Phone - [phone]   Fax - [phone]  [facility] [place] Wyvonne Lenz MD [address] [place] [address] Phone [phone]  Fax [phone]

## 2018-04-20 NOTE — Addendum Note (Signed)
Addended by: Ernestina PatchesAPEL, Karolyne Timmons S on: 04/20/2018 11:56 AM   Modules accepted: Orders

## 2018-04-20 NOTE — Progress Notes (Signed)
   PRENATAL VISIT NOTE  Subjective:  Melissa Foster is a 22 y.o. G2P0010 at 3686w6d being seen today for ongoing prenatal care.  She is currently monitored for the following issues for this low-risk pregnancy and has Supervision of normal first pregnancy, antepartum; Encounter for antenatal screening for malformation; Obesity affecting pregnancy in second trimester; and [redacted] weeks gestation of pregnancy on their problem list.  Patient reports no complaints.  Contractions: Not present. Vag. Bleeding: None.  Movement: Present. Denies leaking of fluid.   The following portions of the patient's history were reviewed and updated as appropriate: allergies, current medications, past family history, past medical history, past social history, past surgical history and problem list. Problem list updated.  Objective:   Vitals:   04/20/18 0946  BP: (!) 125/58  Pulse: 77  Weight: 222 lb 12.8 oz (101.1 kg)    Fetal Status: Fetal Heart Rate (bpm): 143 Fundal Height: 29 cm Movement: Present     General:  Alert, oriented and cooperative. Patient is in no acute distress.  Skin: Skin is warm and dry. No rash noted.   Cardiovascular: Normal heart rate noted  Respiratory: Normal respiratory effort, no problems with respiration noted  Abdomen: Soft, gravid, appropriate for gestational age.  Pain/Pressure: Absent     Pelvic: Cervical exam deferred        Extremities: Normal range of motion.  Edema: None  Mental Status: Normal mood and affect. Normal behavior. Normal judgment and thought content.   Assessment and Plan:  Pregnancy: G2P0010 at 7186w6d  1. Need for Tdap vaccination - Tdap vaccine greater than or equal to 7yo IM  2. Supervision of normal first pregnancy, antepartum  - Doing well, 2 hour GTT done today   3. Encounter for antenatal screening for malformation  - anatomy scan not complete, F/U US scheduled   Preterm labor symptoms and general obstetric precautions including but not  limited to vaginal bleeding, contractions, leaking of fluid and fetal movement were reviewed in detail with the patient. Please refer to After Visit Summary for other counseling recommendations.  Return in about 2 weeks (around 05/04/2018).  No future appointments.  Venia CarbonJennifer Hattie [place], NP

## 2018-04-21 LAB — HIV ANTIBODY (ROUTINE TESTING W REFLEX): HIV SCREEN 4TH GENERATION: NONREACTIVE

## 2018-04-21 LAB — CBC
HEMATOCRIT: 37.4 % (ref 34.0–46.6)
Hemoglobin: 12.5 g/dL (ref 11.1–15.9)
MCH: 29.6 pg (ref 26.6–33.0)
MCHC: 33.4 g/dL (ref 31.5–35.7)
MCV: 88 fL (ref 79–97)
PLATELETS: 241 10*3/uL (ref 150–450)
RBC: 4.23 x10E6/uL (ref 3.77–5.28)
RDW: 13.7 % (ref 12.3–15.4)
WBC: 10.2 10*3/uL (ref 3.4–10.8)

## 2018-04-21 LAB — RPR: RPR Ser Ql: NONREACTIVE

## 2018-04-21 LAB — GLUCOSE TOLERANCE, 2 HOURS W/ 1HR
GLUCOSE, 2 HOUR: 111 mg/dL (ref 65–152)
Glucose, 1 hour: 99 mg/dL (ref 65–179)
Glucose, Fasting: 79 mg/dL (ref 65–91)

## 2018-05-01 ENCOUNTER — Ambulatory Visit (HOSPITAL_COMMUNITY)
Admission: RE | Admit: 2018-05-01 | Discharge: 2018-05-01 | Disposition: A | Discharge status: Home / Self Care | Payer: Medicaid Other | Source: Ambulatory Visit | Attending: Obstetrics and Gynecology | Admitting: Obstetrics and Gynecology

## 2018-05-01 DIAGNOSIS — Z3403 Encounter for supervision of normal first pregnancy, third trimester: Secondary | ICD-10-CM | POA: Insufficient documentation

## 2018-05-01 DIAGNOSIS — Z362 Encounter for other antenatal screening follow-up: Secondary | ICD-10-CM | POA: Diagnosis not present

## 2018-05-01 DIAGNOSIS — Z3A30 30 weeks gestation of pregnancy: Secondary | ICD-10-CM | POA: Diagnosis not present

## 2018-05-01 DIAGNOSIS — O3663X0 Maternal care for excessive fetal growth, third trimester, not applicable or unspecified: Secondary | ICD-10-CM | POA: Diagnosis not present

## 2018-05-01 DIAGNOSIS — Z3400 Encounter for supervision of normal first pregnancy, unspecified trimester: Secondary | ICD-10-CM

## 2018-05-04 ENCOUNTER — Ambulatory Visit (INDEPENDENT_AMBULATORY_CARE_PROVIDER_SITE_OTHER): Payer: Medicaid Other | Admitting: Nurse Practitioner

## 2018-05-04 VITALS — BP 130/67 | HR 79 | Wt 226.3 lb

## 2018-05-04 DIAGNOSIS — Z3403 Encounter for supervision of normal first pregnancy, third trimester: Secondary | ICD-10-CM

## 2018-05-04 DIAGNOSIS — Z3400 Encounter for supervision of normal first pregnancy, unspecified trimester: Secondary | ICD-10-CM

## 2018-05-04 DIAGNOSIS — Z23 Encounter for immunization: Secondary | ICD-10-CM

## 2018-05-04 NOTE — Progress Notes (Signed)
    Subjective:  Melissa Foster is a 22 y.o. G2P0010 at 4368w6d being seen today for ongoing prenatal care.  She is currently monitored for the following issues for this low-risk pregnancy and has Supervision of normal first pregnancy, antepartum and Obesity affecting pregnancy in second trimester on their problem list.  Patient reports no complaints.  Contractions: Not present. Vag. Bleeding: None.  Movement: Present. Denies leaking of fluid.   The following portions of the patient's history were reviewed and updated as appropriate: allergies, current medications, past family history, past medical history, past social history, past surgical history and problem list. Problem list updated.  Objective:   Vitals:   05/04/18 1411  BP: 130/67  Pulse: 79  Weight: 226 lb 4.8 oz (102.6 kg)    Fetal Status: Fetal Heart Rate (bpm): 144 Fundal Height: 31 cm Movement: Present     General:  Alert, oriented and cooperative. Patient is in no acute distress.  Skin: Skin is warm and dry. No rash noted.   Cardiovascular: Normal heart rate noted  Respiratory: Normal respiratory effort, no problems with respiration noted  Abdomen: Soft, gravid, appropriate for gestational age. Pain/Pressure: Absent     Pelvic:  Cervical exam deferred        Extremities: Normal range of motion.  Edema: None  Mental Status: Normal mood and affect. Normal behavior. Normal judgment and thought content.   Urinalysis:      Assessment and Plan:  Pregnancy: G2P0010 at 8068w6d  1. Supervision of normal first pregnancy, antepartum Discussed signing up for childbirth and breastfeeding classes. Doing well.  Getting flu shot today.  - Flu Vaccine QUAD 36+ mos IM  Preterm labor symptoms and general obstetric precautions including but not limited to vaginal bleeding, contractions, leaking of fluid and fetal movement were reviewed in detail with the patient. Please refer to After Visit Summary for other counseling  recommendations.  Return in about 2 weeks (around 05/18/2018).  Nolene BernheimERRI BURLESON, RN, MSN, NP-BC Nurse Practitioner, [facility], [facility] 05/07/2018 10:22 AM

## 2018-05-04 NOTE — Patient Instructions (Addendum)
Childbirth Education Options: [facility] Department Classes:  Childbirth education classes can help you get ready for a positive parenting experience. You can also meet other expectant parents and get free stuff for your baby. Each class runs for five weeks on the same night and costs $45 for the mother-to-be and her support person. Medicaid covers the cost if you are eligible. Call [phone] to register. [facility] Childbirth Education:  [phone] or [phone] or [email or web address]  Baby & Me Class: Discuss newborn & infant parenting and family adjustment issues with other new mothers in a relaxed environment. Each week brings a new speaker or baby-centered activity. We encourage new mothers to join [place] every Thursday at 11:00am. Babies birth until crawling. No registration or fee. Daddy [employer]: This course offers Dads-to-be the tools and knowledge needed to feel confident on their journey to becoming new fathers. Experienced dads, who have been trained as coaches, teach dads-to-be how to hold, comfort, diaper, swaddle and play with their infant while being able to support the new mom as well. A class for men taught by men. $25/dad Big Brother/Big Sister: Let your children share in the joy of a new brother or sister in this special class designed just for them. Class includes discussion about how families care for babies: swaddling, holding, diapering, safety as well as how they can be helpful in their new role. This class is designed for children ages 45 to 48, but any age is welcome. Please register each child individually. $5/child  Mom Talk: This mom-led group offers support and connection to mothers as they journey through the adjustments and struggles of that sometimes overwhelming first year after the birth of a child. Tuesdays at 10:00am and Thursdays at 6:00pm. Babies welcome. No registration or fee. Breastfeeding Support Group: This group is a mother-to-mother  support circle where moms have the opportunity to share their breastfeeding experiences. A Lactation Consultant is present for questions and concerns. Meets each Tuesday at 11:00am. No fee or registration. Breastfeeding Your Baby: Learn what to expect in the first days of breastfeeding your newborn.  This class will help you feel more confident with the skills needed to begin your breastfeeding experience. Many new mothers are concerned about breastfeeding after leaving the hospital. This class will also address the most common fears and challenges about breastfeeding during the first few weeks, months and beyond. (call for fee) Comfort Techniques and Tour: This 2 hour interactive class will provide you the opportunity to learn & practice hands-on techniques that can help relieve some of the discomfort of labor and encourage your baby to rotate toward the best position for birth. You and your partner will be able to try a variety of labor positions with birth balls and rebozos as well as practice breathing, relaxation, and visualization techniques. A tour of the [facility] is included with this class. $20 per registrant and support person Childbirth Class- Weekend Option: This class is a Weekend version of our Birth & Baby series. It is designed for parents who have a difficult time fitting several weeks of classes into their schedule. It covers the care of your newborn and the basics of labor and childbirth. It also includes a [place] [facility] and lunch. The class is held two consecutive days: beginning on Friday evening from 6:30 - 8:30 p.m. and the next day, Saturday from 9 a.m. - 4 p.m. (call for fee) Doren Custard Class: Interested in a waterbirth?  This  informational class will help you discover whether waterbirth is the right fit for you. Education about waterbirth itself, supplies you would need and how to assemble your support team is what you can  expect from this class. Some obstetrical practices require this class in order to pursue a waterbirth. (Not all obstetrical practices offer waterbirth-check with your healthcare provider.) Register only the expectant mom, but you are encouraged to bring your partner to class! Required if planning waterbirth, no fee. Infant/Child CPR: Parents, grandparents, babysitters, and friends learn Cardio-Pulmonary Resuscitation skills for infants and children. You will also learn how to treat both conscious and unconscious choking in infants and children. This Family & Friends program does not offer certification. Register each participant individually to ensure that enough mannequins are available. (Call for fee) Grandparent Love: Expecting a grandbaby? This class is for you! Learn about the latest infant care and safety recommendations and ways to support your own child as he or she transitions into the parenting role. Taught by Registered Nurses who are childbirth instructors, but most importantly...they are grandmothers too! $10/person. Childbirth Class- Natural Childbirth: This series of 5 weekly classes is for expectant parents who want to learn and practice natural methods of coping with the process of labor and childbirth. Relaxation, breathing, massage, visualization, role of the partner, and helpful positioning are highlighted. Participants learn how to be confident in their body's ability to give birth. This class will empower and help parents make informed decisions about their own care. Includes discussion that will help new parents transition into the immediate postpartum period. Maternity Care Center Tour of [facility] is included. We suggest taking this class between 25-32 weeks, but it's only a recommendation. $75 per registrant and one support person or $30 Medicaid. Childbirth Class- 3 week Series: This option of 3 weekly classes helps you and your labor partner prepare for childbirth. Newborn  care, labor & birth, cesarean birth, pain management, and comfort techniques are discussed and a Maternity Care Center Tour of [facility] is included. The class meets at the same time, on the same day of the week for 3 consecutive weeks beginning with the starting date you choose. $60 for registrant and one support person.  Marvelous Multiples: Expecting twins, triplets, or more? This class covers the differences in labor, birth, parenting, and breastfeeding issues that face multiples' parents. NICU tour is included. Led by a Certified Childbirth Educator who is the mother of twins. No fee. Caring for Baby: This class is for expectant and adoptive parents who want to learn and practice the most up-to-date newborn care for their babies. Focus is on birth through the first six weeks of life. Topics include feeding, bathing, diapering, crying, umbilical cord care, circumcision care and safe sleep. Parents learn to recognize symptoms of illness and when to call the pediatrician. Register only the mom-to-be and your partner or support person can plan to come with you! $10 per registrant and support person Childbirth Class- online option: This online class offers you the freedom to complete a Birth and Baby series in the comfort of your own home. The flexibility of this option allows you to review sections at your own pace, at times convenient to you and your support people. It includes additional video information, animations, quizzes, and extended activities. Get organized with helpful eClass tools, checklists, and trackers. Once you register online for the class, you will receive an email within a few days to accept the invitation and begin the class when the time   is right for you. The content will be available to you for 60 days. $60 for 60 days of online access for you and your support people.  Local Doulas: Natural Baby Doulas [email or web address] Tel:  [phone] [email or web address] [employer] [phone] [email or web address] The Labor Hassell Halim  (also do waterbirth tub rental) [phone] [email or web address] Triad Birth Doula [phone] [email or web address] NotebookDistributors.fi Sacred Rhythms  [phone] https://sacred-[email or web address]/ [employer] (PADA) [email or web address] https://www.frey.org/ [place] Birth and Baby  http://[email or web address]/ Considering Waterbirth? Guide for patients at Center for [employer]  Why consider waterbirth?  . Gentle birth for babies . Less pain medicine used in labor . May allow for passive descent/less pushing . May reduce perineal tears  . More mobility and instinctive maternal position changes . Increased maternal relaxation . Reduced blood pressure in labor  Is waterbirth safe? What are the risks of infection, drowning or other complications?  . Infection: o Very low risk (3.7 % for tub vs 4.8% for bed) o 7 in 8000 waterbirths with documented infection o Poorly cleaned equipment most common cause o Slightly lower group B strep transmission rate  . Drowning o Maternal:  - Very low risk   - Related to seizures or fainting o Newborn:  - Very low risk. No evidence of increased risk of respiratory problems in multiple large studies - Physiological protection from breathing under water - Avoid underwater birth if there are any fetal complications - Once baby's head is out of the water, keep it out.  . Birth complication o Some reports of cord trauma, but risk decreased by bringing baby to surface gradually o No evidence of increased risk of shoulder dystocia. Mothers can usually change positions faster in water than in a bed, possibly aiding the maneuvers to free the shoulder.   You must attend a Doren Custard class at [facility]  3rd Wednesday of every month from 7-9pm  [employer] by calling [phone] or online at [email or web address]  Bring [place] the certificate from the class to your prenatal appointment  Meet with a midwife at 36 weeks to see if you can still plan a waterbirth and to sign the consent.   Purchase or rent the following supplies:   Water Birth Pool (Birth Pool in a Box or [place] for instance)  (Tubs start ~$125)  Single-use disposable tub liner designed for your brand of tub  New garden hose labeled "lead-free", "suitable for drinking water",  Electric drain pump to remove water (We recommend 792 gallon per hour or greater pump.)   Separate garden hose to remove the dirty water  Fish net  Bathing suit top (optional)  Long-handled mirror (optional)  Places to purchase or rent supplies  GotWebTools.is for tub purchases and supplies  Waterbirthsolutions.com for tub purchases and supplies  The Labor Ladies (www.thelaborladies.com) $275 for tub rental/set-up & take down/kit   [employer] (http://www.fleming.com/.htm) Information regarding doulas (labor support) who provide pool rentals  Our practice has a Birth Pool in a Box tub at the hospital that you may borrow on a first-come-first-served basis. It is your responsibility to to set up, clean and break down the tub. We cannot guarantee the availability of this tub in advance. You are responsible for bringing all accessories listed above. If you do not have all necessary supplies you cannot have a waterbirth.    Things that would prevent you from having a waterbirth:  Premature, <37wks  Previous cesarean birth  Presence of thick meconium-stained fluid  Multiple gestation (Twins,  triplets, etc.)  Uncontrolled diabetes or gestational diabetes requiring medication  Hypertension requiring medication or diagnosis of pre-eclampsia  Heavy vaginal bleeding  Non-reassuring fetal  heart rate  Active infection (MRSA, etc.). Group B Strep is NOT a contraindication for  waterbirth.  If your labor has to be induced and induction method requires continuous  monitoring of the baby's heart rate  Other risks/issues identified by your obstetrical provider  Please remember that birth is unpredictable. Under certain unforeseeable circumstances your provider may advise against giving birth in the tub. These decisions will be made on a case-by-case basis and with the safety of you and your baby as our highest priority.       Braxton Hicks Contractions Contractions of the uterus can occur throughout pregnancy, but they are not always a sign that you are in labor. You may have practice contractions called Braxton Hicks contractions. These false labor contractions are sometimes confused with true labor. What are [place] contractions? Braxton Hicks contractions are tightening movements that occur in the muscles of the uterus before labor. Unlike true labor contractions, these contractions do not result in opening (dilation) and thinning of the cervix. Toward the end of pregnancy (32-34 weeks), Braxton Hicks contractions can happen more often and may become stronger. These contractions are sometimes difficult to tell apart from true labor because they can be very uncomfortable. You should not feel embarrassed if you go to the hospital with false labor. Sometimes, the only way to tell if you are in true labor is for your health care provider to look for changes in the cervix. The health care provider will do a physical exam and may monitor your contractions. If you are not in true labor, the exam should show that your cervix is not dilating and your water has not broken. If there are other health problems associated with your pregnancy, it is completely safe for you to be sent home with false labor. You may continue to have Braxton Hicks contractions until you go into true labor. How  to tell the difference between true labor and false labor True labor  Contractions last 30-70 seconds.  Contractions become very regular.  Discomfort is usually felt in the top of the uterus, and it spreads to the lower abdomen and low back.  Contractions do not go away with walking.  Contractions usually become more intense and increase in frequency.  The cervix dilates and gets thinner. False labor  Contractions are usually shorter and not as strong as true labor contractions.  Contractions are usually irregular.  Contractions are often felt in the front of the lower abdomen and in the groin.  Contractions may go away when you walk around or change positions while lying down.  Contractions get weaker and are shorter-lasting as time goes on.  The cervix usually does not dilate or become thin. Follow these instructions at home:  Take over-the-counter and prescription medicines only as told by your health care provider.  Keep up with your usual exercises and follow other instructions from your health care provider.  Eat and drink lightly if you think you are going into labor.  If Braxton Hicks contractions are making you uncomfortable: ? Change your position from lying down or resting to walking, or change from walking to resting. ? Sit and rest in a tub of warm water. ? Drink enough fluid to keep your urine pale yellow. Dehydration may cause these contractions. ? Do slow and deep breathing several times an hour.  Keep all follow-up prenatal visits as told by your health care provider. This is important. Contact a health care provider if:  You have a fever.  You have continuous pain in your abdomen. Get help right away if:  Your contractions become stronger, more regular, and closer together.  You have fluid leaking or gushing from your vagina.  You pass blood-tinged mucus (bloody show).  You have bleeding from your vagina.  You have low back pain that you  never had before.  You feel your baby's head pushing down and causing pelvic pressure.  Your baby is not moving inside you as much as it used to. Summary  Contractions that occur before labor are called Braxton Hicks contractions, false labor, or practice contractions.  Braxton Hicks contractions are usually shorter, weaker, farther apart, and less regular than true labor contractions. True labor contractions usually become progressively stronger and regular and they become more frequent.  Manage discomfort from [facility] contractions by changing position, resting in a warm bath, drinking plenty of water, or practicing deep breathing. This information is not intended to replace advice given to you by your health care provider. Make sure you discuss any questions you have with your health care provider. Document Released: 12/22/2016 Document Revised: 12/22/2016 Document Reviewed: 12/22/2016 Elsevier Interactive Patient Education  2018 [employer].

## 2018-05-21 ENCOUNTER — Encounter: Payer: Self-pay | Admitting: Advanced Practice Midwife

## 2018-05-21 ENCOUNTER — Ambulatory Visit (INDEPENDENT_AMBULATORY_CARE_PROVIDER_SITE_OTHER): Payer: Medicaid Other | Admitting: Advanced Practice Midwife

## 2018-05-21 DIAGNOSIS — Z3400 Encounter for supervision of normal first pregnancy, unspecified trimester: Secondary | ICD-10-CM

## 2018-05-21 NOTE — Patient Instructions (Signed)
AREA PEDIATRIC/FAMILY PRACTICE PHYSICIANS  [facility] [address], [address] [place], [place]  [address] Phone - [phone]   Fax - [phone]  [facility] [address] [place], [place] [address] Phone - [phone]   Fax - [phone]  [facility] AM[facility] [address] [place], [place]  [address] Phone - [phone]   Fax - [phone]  [facility] [address] [place], [place]  [address] Phone - [phone]   Fax - [phone]  [facility] [address] [place], [place]  [address] Phone - [phone]   Fax - [phone]  [facility] [address] [place], [place]  [address] Phone - [phone]   Fax - [phone]  [facility] [address] [place], [place]  [address] Phone - [phone]   Fax - [phone]  [facility] [address] [place], [place]  [address] Phone - [phone]   Fax - [phone]  [facility] [address] [place], [place]  [address] Phone - [phone]   Fax - [phone] [facility] [address] [place], [place]  [address] Phone - [phone]   Fax - [phone]  [facility] [address] [place], [place]  [address] Phone - [phone]   Fax - [phone]  [facility] [address] [place], [place]  [address] Phone - [phone]   Fax - [phone]  [facility] [address] [place], [place]  [address] Phone - [phone]   Fax - [phone]  SHILPA GOSRANI [address] [place], [place]  [address] Phone - [phone]  [facility] [address] [place], [place]  [address] Phone - [phone]   Fax - [phone]  [facility] [address] [place], [place]  [address] Phone - [phone]   Fax - [phone]  [facility] [address] [place], [place]  [address] Phone - [phone]   Fax - [phone]  [facility] [address] [place], [place]  [address] Phone - [phone]   Fax - [phone]   [facility] [address] [place], [place]  [address] Phone - [phone]   Fax - [phone]  [facility] [address] [place], [place]  [address] Phone - [phone]   Fax - [phone]  DAVID RUBIN [address] [place], [place]  [address] Phone - [phone]   Fax - [phone]  [facility] [address] [place], [place]  [address] Phone - [phone]   Fax - [phone]  [facility] [address] [place], [place]  [address] Phone - [phone]   Fax - [phone] [place] [facility] [address] [place], [place]  [address] Phone - [phone]   Fax - [phone]  [place] [facility] [address] [place], [place]  [address] Phone - [phone]   Fax - [phone]  [facility] [address] [place], [place]  [address] Phone - [phone]   Fax - [phone]  [facility] Charlene Flemming MD [address] [place]  [address] Phone [phone]  Fax [phone]   

## 2018-05-21 NOTE — Progress Notes (Signed)
   PRENATAL VISIT NOTE  Subjective:  Melissa Foster is a 22 y.o. G2P0010 at [redacted]w[redacted]d being seen today for ongoing prenatal care.  She is currently monitored for the following issues for this low-risk pregnancy and has Supervision of normal first pregnancy, antepartum and Obesity affecting pregnancy in second trimester on their problem list.  Patient reports no complaints.  Contractions: Not present. Vag. Bleeding: None.  Movement: Present. Denies leaking of fluid.   The following portions of the patient's history were reviewed and updated as appropriate: allergies, current medications, past family history, past medical history, past social history, past surgical history and problem list. Problem list updated.  Objective:   Vitals:   05/21/18 1117  BP: 117/62  Pulse: 75  Weight: 226 lb (102.5 kg)    Fetal Status: Fetal Heart Rate (bpm): 146 Fundal Height: 33 cm Movement: Present     General:  Alert, oriented and cooperative. Patient is in no acute distress.  Skin: Skin is warm and dry. No rash noted.   Cardiovascular: Normal heart rate noted  Respiratory: Normal respiratory effort, no problems with respiration noted  Abdomen: Soft, gravid, appropriate for gestational age.  Pain/Pressure: Absent     Pelvic: Cervical exam deferred        Extremities: Normal range of motion.  Edema: None  Mental Status: Normal mood and affect. Normal behavior. Normal judgment and thought content.   Assessment and Plan:  Pregnancy: G2P0010 at [redacted]w[redacted]d  1. Supervision of normal first pregnancy, antepartum - Routine care - AFP was ordered at prior provider office, but result is not in with current transfer records. Will attempt to get this result.   Preterm labor symptoms and general obstetric precautions including but not limited to vaginal bleeding, contractions, leaking of fluid and fetal movement were reviewed in detail with the patient. Please refer to After Visit Summary for other counseling  recommendations.  Return in about 2 weeks (around 06/04/2018).  No future appointments.  Thressa Sheller, CNM

## 2018-06-05 ENCOUNTER — Ambulatory Visit (INDEPENDENT_AMBULATORY_CARE_PROVIDER_SITE_OTHER): Payer: Medicaid Other | Admitting: Advanced Practice Midwife

## 2018-06-05 ENCOUNTER — Encounter: Payer: Self-pay | Admitting: Advanced Practice Midwife

## 2018-06-05 DIAGNOSIS — Z3400 Encounter for supervision of normal first pregnancy, unspecified trimester: Secondary | ICD-10-CM

## 2018-06-05 NOTE — Progress Notes (Signed)
   PRENATAL VISIT NOTE  Subjective:  Melissa Foster is a 22 y.o. G2P0010 at [redacted]w[redacted]d being seen today for ongoing prenatal care.  She is currently monitored for the following issues for this low-risk pregnancy and has Supervision of normal first pregnancy, antepartum and Obesity affecting pregnancy in second trimester on their problem list.  Patient reports no complaints.  Contractions: Irregular. Vag. Bleeding: None.  Movement: Present. Denies leaking of fluid.   The following portions of the patient's history were reviewed and updated as appropriate: allergies, current medications, past family history, past medical history, past social history, past surgical history and problem list. Problem list updated.  Objective:   Vitals:   06/05/18 1135  BP: 138/70  Pulse: 70  Weight: 230 lb 4.8 oz (104.5 kg)    Fetal Status: Fetal Heart Rate (bpm): 135 Fundal Height: 35 cm Movement: Present     General:  Alert, oriented and cooperative. Patient is in no acute distress.  Skin: Skin is warm and dry. No rash noted.   Cardiovascular: Normal heart rate noted  Respiratory: Normal respiratory effort, no problems with respiration noted  Abdomen: Soft, gravid, appropriate for gestational age.  Pain/Pressure: Present     Pelvic: Cervical exam deferred        Extremities: Normal range of motion.  Edema: None  Mental Status: Normal mood and affect. Normal behavior. Normal judgment and thought content.   Assessment and Plan:  Pregnancy: G2P0010 at [redacted]w[redacted]d  1. Supervision of normal first pregnancy, antepartum - Routine care - GBS at next visit   Preterm labor symptoms and general obstetric precautions including but not limited to vaginal bleeding, contractions, leaking of fluid and fetal movement were reviewed in detail with the patient. Please refer to After Visit Summary for other counseling recommendations.  Return in about 1 week (around 06/12/2018).  No future appointments.  Thressa Sheller, CNM

## 2018-06-05 NOTE — Patient Instructions (Addendum)
Vaginal delivery means that you will give birth by pushing your baby out of your birth canal (vagina). A team of health care providers will help you before, during, and after vaginal delivery. Birth experiences are unique for every woman and every pregnancy, and birth experiences vary depending on where you choose to give birth. What should I do to prepare for my baby's birth? Before your baby is born, it is important to talk with your health care provider about:  Your labor and delivery preferences. These may include: ? Medicines that you may be given. ? How you will manage your pain. This might include non-medical pain relief techniques or injectable pain relief such as epidural analgesia. ? How you and your baby will be monitored during labor and delivery. ? Who may be in the labor and delivery room with you. ? Your feelings about surgical delivery of your baby (cesarean delivery, or C-section) if this becomes necessary. ? Your feelings about receiving donated blood through an IV tube (blood transfusion) if this becomes necessary.  Whether you are able: ? To take pictures or videos of the birth. ? To eat during labor and delivery. ? To move around, walk, or change positions during labor and delivery.  What to expect after your baby is born, such as: ? Whether delayed umbilical cord clamping and cutting is offered. ? Who will care for your baby right after birth. ? Medicines or tests that may be recommended for your baby. ? Whether breastfeeding is supported in your hospital or birth center. ? How long you will be in the hospital or birth center.  How any medical conditions you have may affect your baby or your labor and delivery experience.  To prepare for your baby's birth, you should also:  Attend all of your health care visits before delivery (prenatal visits) as recommended by your health care provider. This is important.  Prepare your home for your baby's arrival. Make sure  that you have: ? Diapers. ? Baby clothing. ? Feeding equipment. ? Safe sleeping arrangements for you and your baby.  Install a car seat in your vehicle. Have your car seat checked by a certified car seat installer to make sure that it is installed safely.  Think about who will help you with your new baby at home for at least the first several weeks after delivery.  What can I expect when I arrive at the birth center or hospital? Once you are in labor and have been admitted into the hospital or birth center, your health care provider may:  Review your pregnancy history and any concerns you have.  Insert an IV tube into one of your veins. This is used to give you fluids and medicines.  Check your blood pressure, pulse, temperature, and heart rate (vital signs).  Check whether your bag of water (amniotic sac) has broken (ruptured).  Talk with you about your birth plan and discuss pain control options.  Monitoring Your health care provider may monitor your contractions (uterine monitoring) and your baby's heart rate (fetal monitoring). You may need to be monitored:  Often, but not continuously (intermittently).  All the time or for long periods at a time (continuously). Continuous monitoring may be needed if: ? You are taking certain medicines, such as medicine to relieve pain or make your contractions stronger. ? You have pregnancy or labor complications.  Monitoring may be done by:  Placing a special stethoscope or a handheld monitoring device on your abdomen to check your   baby's heartbeat, and feeling your abdomen for contractions. This method of monitoring does not continuously record your baby's heartbeat or your contractions.  Placing monitors on your abdomen (external monitors) to record your baby's heartbeat and the frequency and length of contractions. You may not have to wear external monitors all the time.  Placing monitors inside of your uterus (internal monitors) to  record your baby's heartbeat and the frequency, length, and strength of your contractions. ? Your health care provider may use internal monitors if he or she needs more information about the strength of your contractions or your baby's heart rate. ? Internal monitors are put in place by passing a thin, flexible wire through your vagina and into your uterus. Depending on the type of monitor, it may remain in your uterus or on your baby's head until birth. ? Your health care provider will discuss the benefits and risks of internal monitoring with you and will ask for your permission before inserting the monitors.  Telemetry. This is a type of continuous monitoring that can be done with external or internal monitors. Instead of having to stay in bed, you are able to move around during telemetry. Ask your health care provider if telemetry is an option for you.  Physical exam Your health care provider may perform a physical exam. This may include:  Checking whether your baby is positioned: ? With the head toward your vagina (head-down). This is most common. ? With the head toward the top of your uterus (head-up or breech). If your baby is in a breech position, your health care provider may try to turn your baby to a head-down position so you can deliver vaginally. If it does not seem that your baby can be born vaginally, your provider may recommend surgery to deliver your baby. In rare cases, you may be able to deliver vaginally if your baby is head-up (breech delivery). ? Lying sideways (transverse). Babies that are lying sideways cannot be delivered vaginally.  Checking your cervix to determine: ? Whether it is thinning out (effacing). ? Whether it is opening up (dilating). ? How low your baby has moved into your birth canal.  What are the three stages of labor and delivery?  Normal labor and delivery is divided into the following three stages: Stage 1  Stage 1 is the longest stage of labor,  and it can last for hours or days. Stage 1 includes: ? Early labor. This is when contractions may be irregular, or regular and mild. Generally, early labor contractions are more than 10 minutes apart. ? Active labor. This is when contractions get longer, more regular, more frequent, and more intense. ? The transition phase. This is when contractions happen very close together, are very intense, and may last longer than during any other part of labor.  Contractions generally feel mild, infrequent, and irregular at first. They get stronger, more frequent (about every 2-3 minutes), and more regular as you progress from early labor through active labor and transition.  Many women progress through stage 1 naturally, but you may need help to continue making progress. If this happens, your health care provider may talk with you about: ? Rupturing your amniotic sac if it has not ruptured yet. ? Giving you medicine to help make your contractions stronger and more frequent.  Stage 1 ends when your cervix is completely dilated to 4 inches (10 cm) and completely effaced. This happens at the end of the transition phase. Stage 2  Once your cervix  is completely effaced and dilated to 4 inches (10 cm), you may start to feel an urge to push. It is common for the body to naturally take a rest before feeling the urge to push, especially if you received an epidural or certain other pain medicines. This rest period may last for up to 1-2 hours, depending on your unique labor experience.  During stage 2, contractions are generally less painful, because pushing helps relieve contraction pain. Instead of contraction pain, you may feel stretching and burning pain, especially when the widest part of your baby's head passes through the vaginal opening (crowning).  Your health care provider will closely monitor your pushing progress and your baby's progress through the vagina during stage 2.  Your health care provider may  massage the area of skin between your vaginal opening and anus (perineum) or apply warm compresses to your perineum. This helps it stretch as the baby's head starts to crown, which can help prevent perineal tearing. ? In some cases, an incision may be made in your perineum (episiotomy) to allow the baby to pass through the vaginal opening. An episiotomy helps to make the opening of the vagina larger to allow more room for the baby to fit through.  It is very important to breathe and focus so your health care provider can control the delivery of your baby's head. Your health care provider may have you decrease the intensity of your pushing, to help prevent perineal tearing.  After delivery of your baby's head, the shoulders and the rest of the body generally deliver very quickly and without difficulty.  Once your baby is delivered, the umbilical cord may be cut right away, or this may be delayed for 1-2 minutes, depending on your baby's health. This may vary among health care providers, hospitals, and birth centers.  If you and your baby are healthy enough, your baby may be placed on your chest or abdomen to help maintain the baby's temperature and to help you bond with each other. Some mothers and babies start breastfeeding at this time. Your health care team will dry your baby and help keep your baby warm during this time.  Your baby may need immediate care if he or she: ? Showed signs of distress during labor. ? Has a medical condition. ? Was born too early (prematurely). ? Had a bowel movement before birth (meconium). ? Shows signs of difficulty transitioning from being inside the uterus to being outside of the uterus. If you are planning to breastfeed, your health care team will help you begin a feeding. Stage 3  The third stage of labor starts immediately after the birth of your baby and ends after you deliver the placenta. The placenta is an organ that develops during pregnancy to provide  oxygen and nutrients to your baby in the womb.  Delivering the placenta may require some pushing, and you may have mild contractions. Breastfeeding can stimulate contractions to help you deliver the placenta.  After the placenta is delivered, your uterus should tighten (contract) and become firm. This helps to stop bleeding in your uterus. To help your uterus contract and to control bleeding, your health care provider may: ? Give you medicine by injection, through an IV tube, by mouth, or through your rectum (rectally). ? Massage your abdomen or perform a vaginal exam to remove any blood clots that are left in your uterus. ? Empty your bladder by placing a thin, flexible tube (catheter) into your bladder. ? Encourage you to   breastfeed your baby. After labor is over, you and your baby will be monitored closely to ensure that you are both healthy until you are ready to go home. Your health care team will teach you how to care for yourself and your baby. This information is not intended to replace advice given to you by your health care provider. Make sure you discuss any questions you have with your health care provider. Document Released: 05/17/2008 Document Revised: 02/26/2016 Document Reviewed: 08/23/2015 Elsevier Interactive Patient Education  2018 Elsevier Inc[employer]  AREA PEDIATRIC/FAMILY PRACTICE PHYSICIANS  [facility] [address] [place], [place]  [address] Phone - [phone]   Fax - [phone]  [facility] [address] [place], [place] [address] Phone - [phone]   Fax - [phone]  [facility] [address] [place], [place]  [address] Phone - [phone]   Fax - [phone]  [facility] [address] [place], [place]  [address] Phone - [phone]   Fax - [phone]  [facility] [address] [place], [place]  [address] Phone - [phone]   Fax -  [phone]  [facility] [address] [place], [place]  [address] Phone - [phone]   Fax - [phone]  [facility] [address] [place], [place]  [address] Phone - [phone]   Fax - [phone]  [facility] [address] [place], [place]  [address] Phone - [phone]   Fax - [phone]  [facility] [address] [place], [place]  [address] Phone - [phone]   Fax - [phone] [facility] [address] [place], [place]  [address] Phone - [phone]   Fax - [phone]  [facility] [address] [place], [place]  [address] Phone - [phone]   Fax - [phone]  [facility] [address] [place], [place]  [address] Phone - [phone]   Fax - [phone]  [facility] [address] [place], [place]  [address] Phone - [phone]   Fax - [phone]  SHILPA GOSRANI [address] [place], [place]  [address] Phone - [phone]  [facility] [address] [place], [place]  [address] Phone - [phone]   Fax - [phone]  [facility] [address] [place], [place]  [address] Phone - [phone]   Fax - [phone]  [facility] [address] [place], [place]  [address] Phone - [phone]   Fax - [phone]  [facility] [address] [place], [place]  [address] Phone - [phone]   Fax - [phone]   [facility] [address] [place], [place]  [address] Phone - [phone]   Fax - [phone]  [facility] [address] [place], [place]  [address] Phone - [phone]   Fax - [phone]  DAVID RUBIN [address], [address] [place], [place]  [address] Phone - [phone]   Fax - [phone]  [facility] [address] [place], [place]  [address] Phone - [phone]   Fax - [phone]  [facility] [address] [place], [place]  [address] Phone - [phone]   Fax - [phone] [facility] [address] [place],     [address] Phone - [phone]   Fax - [phone]  [facility] [address] [place], [place]  [address] Phone - [phone]   Fax - [phone]  [facility] [place] [address] [place], [place]  [address] Phone - [phone]   Fax - [phone]  [facility] [place] Wyvonne Lenz MD [address] [place] [address] Phone [phone]  Fax [phone]

## 2018-06-12 ENCOUNTER — Ambulatory Visit (INDEPENDENT_AMBULATORY_CARE_PROVIDER_SITE_OTHER): Payer: Medicaid Other | Admitting: Student

## 2018-06-12 ENCOUNTER — Other Ambulatory Visit (HOSPITAL_COMMUNITY)
Admission: RE | Admit: 2018-06-12 | Discharge: 2018-06-12 | Disposition: A | Discharge status: Home / Self Care | Payer: Medicaid Other | Source: Ambulatory Visit | Attending: Student | Admitting: Student

## 2018-06-12 VITALS — BP 122/60 | HR 75 | Wt 234.5 lb

## 2018-06-12 DIAGNOSIS — Z3400 Encounter for supervision of normal first pregnancy, unspecified trimester: Secondary | ICD-10-CM | POA: Insufficient documentation

## 2018-06-12 DIAGNOSIS — Z3A00 Weeks of gestation of pregnancy not specified: Secondary | ICD-10-CM | POA: Insufficient documentation

## 2018-06-12 LAB — OB RESULTS CONSOLE GBS: GBS: NEGATIVE

## 2018-06-12 NOTE — Progress Notes (Signed)
   PRENATAL VISIT NOTE  Subjective:  Melissa Foster is a 22 y.o. G2P0010 at [redacted]w[redacted]d being seen today for ongoing prenatal care.  She is currently monitored for the following issues for this low-risk pregnancy and has Supervision of normal first pregnancy, antepartum and Obesity affecting pregnancy in second trimester on their problem list.  Patient reports no complaints.  Contractions: Irritability. Vag. Bleeding: None.  Movement: Present. Denies leaking of fluid.   The following portions of the patient's history were reviewed and updated as appropriate: allergies, current medications, past family history, past medical history, past social history, past surgical history and problem list. Problem list updated.  Objective:   Vitals:   06/12/18 1419  BP: 122/60  Pulse: 75  Weight: 234 lb 8 oz (106.4 kg)    Fetal Status: Fetal Heart Rate (bpm): 147 Fundal Height: 36 cm Movement: Present  Presentation: Vertex  General:  Alert, oriented and cooperative. Patient is in no acute distress.  Skin: Skin is warm and dry. No rash noted.   Cardiovascular: Normal heart rate noted  Respiratory: Normal respiratory effort, no problems with respiration noted  Abdomen: Soft, gravid, appropriate for gestational age.  Pain/Pressure: Present     Pelvic: Cervical exam deferred      vertex closed  Extremities: Normal range of motion.  Edema: None  Mental Status: Normal mood and affect. Normal behavior. Normal judgment and thought content.   Assessment and Plan:  Pregnancy: G2P0010 at [redacted]w[redacted]d  1. Supervision of normal first pregnancy, antepartum -Patient doing well; no complaints.  - Culture, beta strep (group b only) - GC/Chlamydia probe amp ([place])not at [facility] -Reviewed pain relief in labor Term labor symptoms and general obstetric precautions including but not limited to vaginal bleeding, contractions, leaking of fluid and fetal movement were reviewed in detail with the patient. Please  refer to After Visit Summary for other counseling recommendations.  No follow-ups on file.  Future Appointments  Date Time Provider Department Center  06/19/2018 10:15 AM Marylene Land, CNM [facility]  06/26/2018 11:15 AM Rasch, Harolyn Rutherford, NP [facility]  07/03/2018 11:15 AM Marylene Land, CNM [facility]    Marylene Land, [place]

## 2018-06-12 NOTE — Patient Instructions (Signed)

## 2018-06-13 LAB — GC/CHLAMYDIA PROBE AMP (~~LOC~~) NOT AT ARMC
CHLAMYDIA, DNA PROBE: NEGATIVE
NEISSERIA GONORRHEA: NEGATIVE

## 2018-06-15 LAB — CULTURE, BETA STREP (GROUP B ONLY): Strep Gp B Culture: NEGATIVE

## 2018-06-19 ENCOUNTER — Ambulatory Visit (INDEPENDENT_AMBULATORY_CARE_PROVIDER_SITE_OTHER): Payer: Medicaid Other | Admitting: Student

## 2018-06-19 VITALS — BP 134/72 | HR 80 | Wt 231.0 lb

## 2018-06-19 DIAGNOSIS — Z3400 Encounter for supervision of normal first pregnancy, unspecified trimester: Secondary | ICD-10-CM

## 2018-06-19 NOTE — Progress Notes (Signed)
   PRENATAL VISIT NOTE  Subjective:  Melissa Foster is a 22 y.o. G2P0010 at [redacted]w[redacted]d being seen today for ongoing prenatal care.  She is currently monitored for the following issues for this low-risk pregnancy and has Supervision of normal first pregnancy, antepartum and Obesity affecting pregnancy in second trimester on their problem list.  Patient reports no complaints.  Contractions: Irregular. Vag. Bleeding: None.  Movement: Present. Denies leaking of fluid.   The following portions of the patient's history were reviewed and updated as appropriate: allergies, current medications, past family history, past medical history, past social history, past surgical history and problem list. Problem list updated.  Objective:   Vitals:   06/19/18 1023  BP: 134/72  Pulse: 80  Weight: 231 lb (104.8 kg)    Fetal Status: Fetal Heart Rate (bpm): 136 Fundal Height: 39 cm Movement: Present     General:  Alert, oriented and cooperative. Patient is in no acute distress.  Skin: Skin is warm and dry. No rash noted.   Cardiovascular: Normal heart rate noted  Respiratory: Normal respiratory effort, no problems with respiration noted  Abdomen: Soft, gravid, appropriate for gestational age.  Pain/Pressure: Present     Pelvic: Cervical exam deferred        Extremities: Normal range of motion.  Edema: None  Mental Status: Normal mood and affect. Normal behavior. Normal judgment and thought content.   Assessment and Plan:  Pregnancy: G2P0010 at [redacted]w[redacted]d  1. Supervision of normal first pregnancy, antepartum    2. Patient doing well; reviewed fetal kick counts, signs of labor and when to come to [facility].   3. Patient will keep looking for a pediatrician in [place]   Term labor symptoms and general obstetric precautions including but not limited to vaginal bleeding, contractions, leaking of fluid and fetal movement were reviewed in detail with the patient. Please refer to After Visit Summary for other  counseling recommendations.  No follow-ups on file.  Future Appointments  Date Time Provider Department Center  06/26/2018 11:15 AM Rasch, Harolyn Rutherford, NP [facility]  07/03/2018 11:15 AM Crisoforo Oxford, Charlesetta Garibaldi, CNM [facility]    Marylene Land, [place]

## 2018-06-19 NOTE — Patient Instructions (Addendum)
Come to MAU when contractions are 5 min apart, lasting one minute, for one hour.    Fetal Movement Counts Patient Name: ________________________________________________ Patient Due Date: ____________________ What is a fetal movement count? A fetal movement count is the number of times that you feel your baby move during a certain amount of time. This may also be called a fetal kick count. A fetal movement count is recommended for every pregnant woman. You may be asked to start counting fetal movements as early as week 28 of your pregnancy. Pay attention to when your baby is most active. You may notice your baby's sleep and wake cycles. You may also notice things that make your baby move more. You should do a fetal movement count:  When your baby is normally most active.  At the same time each day.  A good time to count movements is while you are resting, after having something to eat and drink. How do I count fetal movements? 1. Find a quiet, comfortable area. Sit, or lie down on your side. 2. Write down the date, the start time and stop time, and the number of movements that you felt between those two times. Take this information with you to your health care visits. 3. For 2 hours, count kicks, flutters, swishes, rolls, and jabs. You should feel at least 10 movements during 2 hours. 4. You may stop counting after you have felt 10 movements. 5. If you do not feel 10 movements in 2 hours, have something to eat and drink. Then, keep resting and counting for 1 hour. If you feel at least 4 movements during that hour, you may stop counting. Contact a health care provider if:  You feel fewer than 4 movements in 2 hours.  Your baby is not moving like he or she usually does. Date: ____________ Start time: ____________ Stop time: ____________ Movements: ____________ Date: ____________ Start time: ____________ Stop time: ____________ Movements: ____________ Date: ____________ Start time:  ____________ Stop time: ____________ Movements: ____________ Date: ____________ Start time: ____________ Stop time: ____________ Movements: ____________ Date: ____________ Start time: ____________ Stop time: ____________ Movements: ____________ Date: ____________ Start time: ____________ Stop time: ____________ Movements: ____________ Date: ____________ Start time: ____________ Stop time: ____________ Movements: ____________ Date: ____________ Start time: ____________ Stop time: ____________ Movements: ____________ Date: ____________ Start time: ____________ Stop time: ____________ Movements: ____________ This information is not intended to replace advice given to you by your health care provider. Make sure you discuss any questions you have with your health care provider. Document Released: 09/07/2006 Document Revised: 04/06/2016 Document Reviewed: 09/17/2015 Elsevier Interactive Patient Education  [employer].

## 2018-06-20 ENCOUNTER — Inpatient Hospital Stay (HOSPITAL_COMMUNITY)
Admission: AD | Admit: 2018-06-20 | Discharge: 2018-06-20 | Disposition: A | Discharge status: Home / Self Care | Payer: Medicaid Other | Source: Ambulatory Visit | Attending: Obstetrics & Gynecology | Admitting: Obstetrics & Gynecology

## 2018-06-20 ENCOUNTER — Encounter (HOSPITAL_COMMUNITY): Payer: Self-pay

## 2018-06-20 ENCOUNTER — Other Ambulatory Visit: Payer: Self-pay

## 2018-06-20 DIAGNOSIS — O26893 Other specified pregnancy related conditions, third trimester: Secondary | ICD-10-CM | POA: Diagnosis not present

## 2018-06-20 DIAGNOSIS — N898 Other specified noninflammatory disorders of vagina: Secondary | ICD-10-CM | POA: Diagnosis not present

## 2018-06-20 DIAGNOSIS — Z0371 Encounter for suspected problem with amniotic cavity and membrane ruled out: Secondary | ICD-10-CM

## 2018-06-20 DIAGNOSIS — Z3A37 37 weeks gestation of pregnancy: Secondary | ICD-10-CM | POA: Diagnosis not present

## 2018-06-20 LAB — AMNISURE RUPTURE OF MEMBRANE (ROM) NOT AT ARMC: Amnisure ROM: NEGATIVE

## 2018-06-20 NOTE — Discharge Instructions (Signed)

## 2018-06-20 NOTE — MAU Note (Signed)
Pt was seen in MAU to rule out rupture of membranes.  Cervical exam deferred by NP, pt not uncomfortable with ctx.

## 2018-06-20 NOTE — MAU Provider Note (Signed)
  S: Ms. Melissa Foster is a 22 y.o. G2P0010 at [redacted]w[redacted]d  who presents to MAU today complaining of leaking of fluid since 1530. She denies vaginal bleeding. She denies contractions. She reports normal fetal movement.    O: BP 121/65 (BP Location: Right Arm)   Pulse 87   Resp 16   Ht 5' 5.5" (1.664 m)   Wt 105.2 kg   LMP 09/18/2017   SpO2 99%   BMI 38.02 kg/m  GENERAL: Well-developed, well-nourished female in no acute distress.  HEAD: Normocephalic, atraumatic.  CHEST: Normal effort of breathing, regular heart rate ABDOMEN: Soft, nontender, gravid  Cervical exam: deferred, not feeling contractions at this time.   Fetal Monitoring: Baseline: 135 bpm Variability: Moderate  Accelerations: 15x15 Decelerations: None Contractions: Irregular pattern   Results for orders placed or performed during the hospital encounter of 06/20/18 (from the past 24 hour(s))  Amnisure rupture of membrane (rom)not at [facility]     Status: None   Collection Time: 06/20/18  6:00 PM  Result Value Ref Range   Amnisure ROM NEGATIVE      A: SIUP at [redacted]w[redacted]d  Membranes intact  P: Discharge home in stable condition  Return to MAU if symptoms worsen F/u with OB as scheduled or sooner if needed   Venia Carbon I, NP 06/20/2018 6:34 PM

## 2018-06-20 NOTE — MAU Note (Signed)
Pt reports ? Leaking fluid since 1530, some back pian.

## 2018-06-23 ENCOUNTER — Inpatient Hospital Stay (HOSPITAL_COMMUNITY)
Admission: AD | Admit: 2018-06-23 | Discharge: 2018-06-23 | Disposition: A | Discharge status: Home / Self Care | Payer: Medicaid Other | Attending: Obstetrics and Gynecology | Admitting: Obstetrics and Gynecology

## 2018-06-23 ENCOUNTER — Encounter (HOSPITAL_COMMUNITY): Payer: Self-pay | Admitting: *Deleted

## 2018-06-23 DIAGNOSIS — Z3689 Encounter for other specified antenatal screening: Secondary | ICD-10-CM | POA: Diagnosis not present

## 2018-06-23 DIAGNOSIS — O471 False labor at or after 37 completed weeks of gestation: Secondary | ICD-10-CM | POA: Insufficient documentation

## 2018-06-23 DIAGNOSIS — O368130 Decreased fetal movements, third trimester, not applicable or unspecified: Secondary | ICD-10-CM | POA: Diagnosis not present

## 2018-06-23 DIAGNOSIS — Z3A38 38 weeks gestation of pregnancy: Secondary | ICD-10-CM

## 2018-06-23 DIAGNOSIS — O479 False labor, unspecified: Secondary | ICD-10-CM

## 2018-06-23 NOTE — MAU Provider Note (Signed)
History     CSN: [id number]  Arrival date and time: 06/23/18 2101   First Provider Initiated Contact with Patient 06/23/18 2208      Chief Complaint  Patient presents with  . Contractions  . Decreased Fetal Movement   HPI Melissa Foster is a 22 y.o. G2P0010 at [redacted]w[redacted]d who presents with contractions and decreased fetal movement. She states the contractions are every 5-10 minutes and worsening. She states she hasn't felt the baby move as much since the contractions started. Denies leaking or bleeding.   OB History    Gravida  2   Para      Term      Preterm      AB  1   Living        SAB  1   TAB      Ectopic      Multiple      Live Births              Past Medical History:  Diagnosis Date  . Medical history non-contributory     Past Surgical History:  Procedure Laterality Date  . NO PAST SURGERIES      History reviewed. No pertinent family history.  Social History   Tobacco Use  . Smoking status: Never Smoker  . Smokeless tobacco: Never Used  Substance Use Topics  . Alcohol use: Not Currently    Comment: occasionally  . Drug use: No    Allergies: No Known Allergies  Medications Prior to Admission  Medication Sig Dispense Refill Last Dose  . Prenatal Multivit-Min-Fe-FA (PRENATAL VITAMINS) 0.8 MG tablet Take 1 tablet by mouth daily. 30 tablet 12 Taking    Review of Systems  Constitutional: Negative.  Negative for fatigue and fever.  HENT: Negative.   Respiratory: Negative.  Negative for shortness of breath.   Cardiovascular: Negative.  Negative for chest pain.  Gastrointestinal: Positive for abdominal pain. Negative for constipation, diarrhea, nausea and vomiting.  Genitourinary: Negative.  Negative for dysuria, vaginal bleeding and vaginal discharge.  Neurological: Negative.  Negative for dizziness and headaches.   Physical Exam   Blood pressure 128/64, pulse 84, temperature 97.6 F (36.4 C), temperature source Oral,  resp. rate 16, height 5' 5.5" (1.664 m), weight 107 kg, last menstrual period 09/18/2017, unknown if currently breastfeeding.  Physical Exam  Nursing note and vitals reviewed. Constitutional: She is oriented to person, place, and time. She appears well-developed and well-nourished. No distress.  HENT:  Head: Normocephalic.  Eyes: Pupils are equal, round, and reactive to light.  Cardiovascular: Normal rate, regular rhythm and normal heart sounds.  Respiratory: Effort normal and breath sounds normal. No respiratory distress.  GI: Soft. Bowel sounds are normal. She exhibits no distension. There is no tenderness.  Neurological: She is alert and oriented to person, place, and time.  Skin: Skin is warm and dry.  Psychiatric: She has a normal mood and affect. Her behavior is normal. Judgment and thought content normal.   Fetal Tracing:  Baseline: 130 Variability: moderate Accels: 15x15 Decels: none  Toco: 3-10   Dilation: 1.5 Effacement (%): Thick Cervical Position: Posterior Station: Ballotable Presentation: Vertex Exam by:: Exie Parody, RN   MAU Course  Procedures  MDM NST- reactive Patient reports feeling regular fetal movement since being in MAU  Assessment and Plan   1. Decreased fetal movements in third trimester, single or unspecified fetus   2. NST (non-stress test) reactive   3. [redacted] weeks gestation of pregnancy  4. False labor    -Discharge home in stable condition -Fetal kick count precautions discussed -Patient advised to follow-up with [facility] as scheduled for prenatal care -Patient may return to MAU as needed or if her condition were to change or worsen   Rolm Bookbinder CNM 06/23/2018, 10:08 PM

## 2018-06-23 NOTE — MAU Note (Signed)
Pt states she has not felt her baby move since her contractions started around 1700 this evening.  Denies LOF/VB. Some back pain as well.

## 2018-06-23 NOTE — Discharge Instructions (Signed)

## 2018-06-23 NOTE — Progress Notes (Signed)
Pt reports fetal movement since being in MAU.

## 2018-06-26 ENCOUNTER — Ambulatory Visit (INDEPENDENT_AMBULATORY_CARE_PROVIDER_SITE_OTHER): Payer: Medicaid Other | Admitting: Obstetrics and Gynecology

## 2018-06-26 VITALS — BP 121/70 | HR 85 | Wt 233.5 lb

## 2018-06-26 DIAGNOSIS — Z3400 Encounter for supervision of normal first pregnancy, unspecified trimester: Secondary | ICD-10-CM

## 2018-06-26 NOTE — Progress Notes (Signed)
   PRENATAL VISIT NOTE  Subjective:  Melissa Foster is a 22 y.o. G2P0010 at [redacted]w[redacted]d being seen today for ongoing prenatal care.  She is currently monitored for the following issues for this low-risk pregnancy and has Supervision of normal first pregnancy, antepartum and Obesity affecting pregnancy in second trimester on their problem list.  Patient reports Increased swelling .  Contractions: Irregular. Vag. Bleeding: Bloody Show.  Movement: Present. Denies leaking of fluid.   The following portions of the patient's history were reviewed and updated as appropriate: allergies, current medications, past family history, past medical history, past social history, past surgical history and problem list. Problem list updated.  Objective:   Vitals:   06/26/18 1112  BP: 121/70  Pulse: 85  Weight: 233 lb 8 oz (105.9 kg)    Fetal Status: Fetal Heart Rate (bpm): 154 Fundal Height: 38 cm Movement: Present  Presentation: Vertex  General:  Alert, oriented and cooperative. Patient is in no acute distress.  Skin: Skin is warm and dry. No rash noted.   Cardiovascular: Normal heart rate noted  Respiratory: Normal respiratory effort, no problems with respiration noted  Abdomen: Soft, gravid, appropriate for gestational age.  Pain/Pressure: Present     Pelvic: Cervical exam performed Dilation: 1 Effacement (%): 80 Station: -3  Extremities: Normal range of motion.  Edema: Mild pitting, slight indentation  Mental Status: Normal mood and affect. Normal behavior. Normal judgment and thought content.   Assessment and Plan:  Pregnancy: G2P0010 at [redacted]w[redacted]d  1. Supervision of normal first pregnancy, antepartum  - doing well - GBS negative.    There are no diagnoses linked to this encounter. Term labor symptoms and general obstetric precautions including but not limited to vaginal bleeding, contractions, leaking of fluid and fetal movement were reviewed in detail with the patient. Please refer to  After Visit Summary for other counseling recommendations.  Return in about 1 week (around 07/03/2018).  Future Appointments  Date Time Provider Department Center  07/03/2018 11:15 AM Crisoforo Oxford, Charlesetta Garibaldi, CNM [facility]    Venia Carbon, NP

## 2018-07-02 ENCOUNTER — Inpatient Hospital Stay (HOSPITAL_COMMUNITY)
Admission: AD | Admit: 2018-07-02 | Discharge: 2018-07-02 | Disposition: A | Discharge status: Home / Self Care | Payer: Medicaid Other | Source: Ambulatory Visit | Attending: Obstetrics & Gynecology | Admitting: Obstetrics & Gynecology

## 2018-07-02 ENCOUNTER — Encounter (HOSPITAL_COMMUNITY): Payer: Self-pay | Admitting: *Deleted

## 2018-07-02 ENCOUNTER — Other Ambulatory Visit: Payer: Self-pay

## 2018-07-02 DIAGNOSIS — Z3A39 39 weeks gestation of pregnancy: Secondary | ICD-10-CM | POA: Insufficient documentation

## 2018-07-02 DIAGNOSIS — O471 False labor at or after 37 completed weeks of gestation: Secondary | ICD-10-CM

## 2018-07-02 DIAGNOSIS — Z3400 Encounter for supervision of normal first pregnancy, unspecified trimester: Secondary | ICD-10-CM

## 2018-07-02 DIAGNOSIS — O99212 Obesity complicating pregnancy, second trimester: Secondary | ICD-10-CM

## 2018-07-02 NOTE — MAU Note (Signed)
Had some pains last night, thought is was kind of normal.  This morning she saw some blood and when she used the restroom, there was more- almost like a period.  Was blood mixed with mucous.  Denies any placental issues on [place].

## 2018-07-02 NOTE — MAU Provider Note (Signed)
History     CSN: [id number]  Arrival date and time: 07/02/18 4098   First Provider Initiated Contact with Patient 07/02/18 0800      Chief Complaint  Patient presents with  . Abdominal Pain  . Vaginal Bleeding   HPI  Melissa Foster is a 22 y.o. G2P0010 at [redacted]w[redacted]d who presents to MAU with chief complaint of bloody show in the setting of labor contractions. This is a new problem, onset this morning at approximately 0500. Pain is rated as 6/10, located bilaterally in her lower abdomen, does not radiate. Denies aggravating or alleviating factors. Has not taken medication or attempted any other treatments. Patient endorses one episode of bloody show which coincided with onset of contractions. Denies heavy vaginal bleeding, leaking of fluid, decreased fetal movement, fever, falls, or recent illness.    OB History    Gravida  2   Para      Term      Preterm      AB  1   Living        SAB  1   TAB      Ectopic      Multiple      Live Births              Past Medical History:  Diagnosis Date  . Medical history non-contributory     Past Surgical History:  Procedure Laterality Date  . NO PAST SURGERIES      Family History  Problem Relation Age of Onset  . Healthy Mother   . Hypertension Father     Social History   Tobacco Use  . Smoking status: Never Smoker  . Smokeless tobacco: Never Used  Substance Use Topics  . Alcohol use: Not Currently    Comment: occasionally  . Drug use: No    Allergies: No Known Allergies  Medications Prior to Admission  Medication Sig Dispense Refill Last Dose  . Prenatal Multivit-Min-Fe-FA (PRENATAL VITAMINS) 0.8 MG tablet Take 1 tablet by mouth daily. 30 tablet 12 Taking    Review of Systems  Constitutional: Positive for fatigue. Negative for chills and fever.  Gastrointestinal: Positive for abdominal pain. Negative for nausea and vomiting.  Genitourinary: Positive for vaginal bleeding. Negative for  difficulty urinating, vaginal discharge and vaginal pain.  Neurological: Negative for dizziness and headaches.  All other systems reviewed and are negative.  Physical Exam   Blood pressure 127/65, pulse 93, temperature 99 F (37.2 C), temperature source Oral, resp. rate 17, weight 108.1 kg, last menstrual period 09/18/2017, unknown if currently breastfeeding.  Physical Exam  Nursing note and vitals reviewed. Constitutional: She is oriented to person, place, and time. She appears well-developed and well-nourished.  Cardiovascular: Normal rate, normal heart sounds and intact distal pulses.  GI: She exhibits no distension. There is no tenderness. There is no rebound and no guarding.  Gravid  Genitourinary: Uterus normal.  Genitourinary Comments: Small amount of bloody show on cervical exam  Neurological: She is alert and oriented to person, place, and time.  Skin: Skin is warm and dry.  Psychiatric: She has a normal mood and affect. Her behavior is normal. Judgment and thought content normal.   1/thick/ballotable   MAU Course/MDM   Reactive fetal tracing: baseline 140, moderate variability, positive 15 x 15 accelerations, no decelerations Toco: irregular mild contractions q 5-11 minutes No cervical change from previous assessment one week ago  Patient Vitals for the past 24 hrs:  BP Temp Temp src Pulse Resp  Weight  07/02/18 0738 127/65 99 F (37.2 C) Oral 93 17 108.1 kg    Assessment and Plan  --22 y.o. G2P0010 at [redacted]w[redacted]d  --Reactive fetal tracing --No cervical change. Reviewed active vs latent labor --Reviewed general obstetric precautions including but not limited to falls, fever, vaginal bleeding, leaking of fluid, decreased fetal movement, headache not relieved by Tylenol, rest and PO hydration. --Discharge home in stable condition  F/U: Patient has OB appt tomorrow   Calvert Cantor, CNM 07/02/2018, 8:50 AM

## 2018-07-02 NOTE — MAU Note (Signed)
Blood and mucus when wiped. No water leaking.  Pain like period cramps

## 2018-07-02 NOTE — Discharge Instructions (Signed)
Vaginal Delivery Vaginal delivery means that you will give birth by pushing your baby out of your birth canal (vagina). A team of health care providers will help you before, during, and after vaginal delivery. Birth experiences are unique for every woman and every pregnancy, and birth experiences vary depending on where you choose to give birth. What should I do to prepare for my baby's birth? Before your baby is born, it is important to talk with your health care provider about:  Your labor and delivery preferences. These may include: ? Medicines that you may be given. ? How you will manage your pain. This might include non-medical pain relief techniques or injectable pain relief such as epidural analgesia. ? How you and your baby will be monitored during labor and delivery. ? Who may be in the labor and delivery room with you. ? Your feelings about surgical delivery of your baby (cesarean delivery, or C-section) if this becomes necessary. ? Your feelings about receiving donated blood through an IV tube (blood transfusion) if this becomes necessary.  Whether you are able: ? To take pictures or videos of the birth. ? To eat during labor and delivery. ? To move around, walk, or change positions during labor and delivery.  What to expect after your baby is born, such as: ? Whether delayed umbilical cord clamping and cutting is offered. ? Who will care for your baby right after birth. ? Medicines or tests that may be recommended for your baby. ? Whether breastfeeding is supported in your hospital or birth center. ? How long you will be in the hospital or birth center.  How any medical conditions you have may affect your baby or your labor and delivery experience.  To prepare for your baby's birth, you should also:  Attend all of your health care visits before delivery (prenatal visits) as recommended by your health care provider. This is important.  Prepare your home for your baby's  arrival. Make sure that you have: ? Diapers. ? Baby clothing. ? Feeding equipment. ? Safe sleeping arrangements for you and your baby.  Install a car seat in your vehicle. Have your car seat checked by a certified car seat installer to make sure that it is installed safely.  Think about who will help you with your new baby at home for at least the first several weeks after delivery.  What can I expect when I arrive at the birth center or hospital? Once you are in labor and have been admitted into the hospital or birth center, your health care provider may:  Review your pregnancy history and any concerns you have.  Insert an IV tube into one of your veins. This is used to give you fluids and medicines.  Check your blood pressure, pulse, temperature, and heart rate (vital signs).  Check whether your bag of water (amniotic sac) has broken (ruptured).  Talk with you about your birth plan and discuss pain control options.  Monitoring Your health care provider may monitor your contractions (uterine monitoring) and your baby's heart rate (fetal monitoring). You may need to be monitored:  Often, but not continuously (intermittently).  All the time or for long periods at a time (continuously). Continuous monitoring may be needed if: ? You are taking certain medicines, such as medicine to relieve pain or make your contractions stronger. ? You have pregnancy or labor complications.  Monitoring may be done by:  Placing a special stethoscope or a handheld monitoring device on your abdomen to   check your baby's heartbeat, and feeling your abdomen for contractions. This method of monitoring does not continuously record your baby's heartbeat or your contractions.  Placing monitors on your abdomen (external monitors) to record your baby's heartbeat and the frequency and length of contractions. You may not have to wear external monitors all the time.  Placing monitors inside of your uterus  (internal monitors) to record your baby's heartbeat and the frequency, length, and strength of your contractions. ? Your health care provider may use internal monitors if he or she needs more information about the strength of your contractions or your baby's heart rate. ? Internal monitors are put in place by passing a thin, flexible wire through your vagina and into your uterus. Depending on the type of monitor, it may remain in your uterus or on your baby's head until birth. ? Your health care provider will discuss the benefits and risks of internal monitoring with you and will ask for your permission before inserting the monitors.  Telemetry. This is a type of continuous monitoring that can be done with external or internal monitors. Instead of having to stay in bed, you are able to move around during telemetry. Ask your health care provider if telemetry is an option for you.  Physical exam Your health care provider may perform a physical exam. This may include:  Checking whether your baby is positioned: ? With the head toward your vagina (head-down). This is most common. ? With the head toward the top of your uterus (head-up or breech). If your baby is in a breech position, your health care provider may try to turn your baby to a head-down position so you can deliver vaginally. If it does not seem that your baby can be born vaginally, your provider may recommend surgery to deliver your baby. In rare cases, you may be able to deliver vaginally if your baby is head-up (breech delivery). ? Lying sideways (transverse). Babies that are lying sideways cannot be delivered vaginally.  Checking your cervix to determine: ? Whether it is thinning out (effacing). ? Whether it is opening up (dilating). ? How low your baby has moved into your birth canal.  What are the three stages of labor and delivery?  Normal labor and delivery is divided into the following three stages: Stage 1  Stage 1 is the  longest stage of labor, and it can last for hours or days. Stage 1 includes: ? Early labor. This is when contractions may be irregular, or regular and mild. Generally, early labor contractions are more than 10 minutes apart. ? Active labor. This is when contractions get longer, more regular, more frequent, and more intense. ? The transition phase. This is when contractions happen very close together, are very intense, and may last longer than during any other part of labor.  Contractions generally feel mild, infrequent, and irregular at first. They get stronger, more frequent (about every 2-3 minutes), and more regular as you progress from early labor through active labor and transition.  Many women progress through stage 1 naturally, but you may need help to continue making progress. If this happens, your health care provider may talk with you about: ? Rupturing your amniotic sac if it has not ruptured yet. ? Giving you medicine to help make your contractions stronger and more frequent.  Stage 1 ends when your cervix is completely dilated to 4 inches (10 cm) and completely effaced. This happens at the end of the transition phase. Stage 2  Once   your cervix is completely effaced and dilated to 4 inches (10 cm), you may start to feel an urge to push. It is common for the body to naturally take a rest before feeling the urge to push, especially if you received an epidural or certain other pain medicines. This rest period may last for up to 1-2 hours, depending on your unique labor experience.  During stage 2, contractions are generally less painful, because pushing helps relieve contraction pain. Instead of contraction pain, you may feel stretching and burning pain, especially when the widest part of your baby's head passes through the vaginal opening (crowning).  Your health care provider will closely monitor your pushing progress and your baby's progress through the vagina during stage 2.  Your  health care provider may massage the area of skin between your vaginal opening and anus (perineum) or apply warm compresses to your perineum. This helps it stretch as the baby's head starts to crown, which can help prevent perineal tearing. ? In some cases, an incision may be made in your perineum (episiotomy) to allow the baby to pass through the vaginal opening. An episiotomy helps to make the opening of the vagina larger to allow more room for the baby to fit through.  It is very important to breathe and focus so your health care provider can control the delivery of your baby's head. Your health care provider may have you decrease the intensity of your pushing, to help prevent perineal tearing.  After delivery of your baby's head, the shoulders and the rest of the body generally deliver very quickly and without difficulty.  Once your baby is delivered, the umbilical cord may be cut right away, or this may be delayed for 1-2 minutes, depending on your baby's health. This may vary among health care providers, hospitals, and birth centers.  If you and your baby are healthy enough, your baby may be placed on your chest or abdomen to help maintain the baby's temperature and to help you bond with each other. Some mothers and babies start breastfeeding at this time. Your health care team will dry your baby and help keep your baby warm during this time.  Your baby may need immediate care if he or she: ? Showed signs of distress during labor. ? Has a medical condition. ? Was born too early (prematurely). ? Had a bowel movement before birth (meconium). ? Shows signs of difficulty transitioning from being inside the uterus to being outside of the uterus. If you are planning to breastfeed, your health care team will help you begin a feeding. Stage 3  The third stage of labor starts immediately after the birth of your baby and ends after you deliver the placenta. The placenta is an organ that develops  during pregnancy to provide oxygen and nutrients to your baby in the womb.  Delivering the placenta may require some pushing, and you may have mild contractions. Breastfeeding can stimulate contractions to help you deliver the placenta.  After the placenta is delivered, your uterus should tighten (contract) and become firm. This helps to stop bleeding in your uterus. To help your uterus contract and to control bleeding, your health care provider may: ? Give you medicine by injection, through an IV tube, by mouth, or through your rectum (rectally). ? Massage your abdomen or perform a vaginal exam to remove any blood clots that are left in your uterus. ? Empty your bladder by placing a thin, flexible tube (catheter) into your bladder. ? Encourage   you to breastfeed your baby. After labor is over, you and your baby will be monitored closely to ensure that you are both healthy until you are ready to go home. Your health care team will teach you how to care for yourself and your baby. This information is not intended to replace advice given to you by your health care provider. Make sure you discuss any questions you have with your health care provider. Document Released: 05/17/2008 Document Revised: 02/26/2016 Document Reviewed: 08/23/2015 Elsevier Interactive Patient Education  2018 [employer]  

## 2018-07-03 ENCOUNTER — Inpatient Hospital Stay (HOSPITAL_COMMUNITY)
Admission: AD | Admit: 2018-07-03 | Discharge: 2018-07-03 | Disposition: A | Discharge status: Home / Self Care | Payer: Medicaid Other | Source: Ambulatory Visit | Attending: Obstetrics and Gynecology | Admitting: Obstetrics and Gynecology

## 2018-07-03 ENCOUNTER — Ambulatory Visit (INDEPENDENT_AMBULATORY_CARE_PROVIDER_SITE_OTHER): Payer: Medicaid Other | Admitting: Student

## 2018-07-03 ENCOUNTER — Encounter (HOSPITAL_COMMUNITY): Payer: Self-pay

## 2018-07-03 ENCOUNTER — Encounter (HOSPITAL_COMMUNITY): Payer: Self-pay | Admitting: *Deleted

## 2018-07-03 ENCOUNTER — Inpatient Hospital Stay (HOSPITAL_COMMUNITY)
Admission: AD | Admit: 2018-07-03 | Discharge: 2018-07-05 | DRG: 807 | Disposition: A | Discharge status: Home / Self Care | Payer: Medicaid Other | Attending: Family Medicine | Admitting: Family Medicine

## 2018-07-03 ENCOUNTER — Other Ambulatory Visit: Payer: Self-pay

## 2018-07-03 VITALS — BP 147/83 | HR 112

## 2018-07-03 DIAGNOSIS — Z3483 Encounter for supervision of other normal pregnancy, third trimester: Secondary | ICD-10-CM | POA: Diagnosis present

## 2018-07-03 DIAGNOSIS — Z3A39 39 weeks gestation of pregnancy: Secondary | ICD-10-CM

## 2018-07-03 DIAGNOSIS — O471 False labor at or after 37 completed weeks of gestation: Secondary | ICD-10-CM | POA: Insufficient documentation

## 2018-07-03 DIAGNOSIS — Z3400 Encounter for supervision of normal first pregnancy, unspecified trimester: Secondary | ICD-10-CM

## 2018-07-03 DIAGNOSIS — E669 Obesity, unspecified: Secondary | ICD-10-CM | POA: Diagnosis present

## 2018-07-03 DIAGNOSIS — O99214 Obesity complicating childbirth: Secondary | ICD-10-CM | POA: Diagnosis present

## 2018-07-03 DIAGNOSIS — O479 False labor, unspecified: Secondary | ICD-10-CM

## 2018-07-03 DIAGNOSIS — O99212 Obesity complicating pregnancy, second trimester: Secondary | ICD-10-CM | POA: Diagnosis present

## 2018-07-03 DIAGNOSIS — O99213 Obesity complicating pregnancy, third trimester: Secondary | ICD-10-CM

## 2018-07-03 LAB — CBC
HEMATOCRIT: 42.5 % (ref 36.0–46.0)
Hemoglobin: 14.2 g/dL (ref 12.0–15.0)
MCH: 29.5 pg (ref 26.0–34.0)
MCHC: 33.4 g/dL (ref 30.0–36.0)
MCV: 88.2 fL (ref 80.0–100.0)
Platelets: 213 10*3/uL (ref 150–400)
RBC: 4.82 MIL/uL (ref 3.87–5.11)
RDW: 14 % (ref 11.5–15.5)
WBC: 13.8 10*3/uL — AB (ref 4.0–10.5)
nRBC: 0 % (ref 0.0–0.2)

## 2018-07-03 LAB — TYPE AND SCREEN
ABO/RH(D): O POS
ANTIBODY SCREEN: NEGATIVE

## 2018-07-03 MED ORDER — LACTATED RINGERS IV SOLN
500.0000 mL | INTRAVENOUS | Status: DC | PRN
  Administered 2018-07-03: 500 mL via INTRAVENOUS

## 2018-07-03 MED ORDER — ACETAMINOPHEN 325 MG PO TABS
650.0000 mg | ORAL_TABLET | ORAL | Status: DC | PRN
  Administered 2018-07-03: 650 mg via ORAL
  Filled 2018-07-03: qty 2

## 2018-07-03 MED ORDER — LIDOCAINE HCL (PF) 1 % IJ SOLN
30.0000 mL | INTRAMUSCULAR | Status: DC | PRN
  Administered 2018-07-03: 30 mL via SUBCUTANEOUS
  Filled 2018-07-03: qty 30

## 2018-07-03 MED ORDER — OXYCODONE-ACETAMINOPHEN 5-325 MG PO TABS: 1.0000 | ORAL_TABLET | ORAL | Status: DC | PRN

## 2018-07-03 MED ORDER — ONDANSETRON HCL 4 MG/2ML IJ SOLN: 4.0000 mg | Freq: Four times a day (QID) | INTRAMUSCULAR | Status: DC | PRN

## 2018-07-03 MED ORDER — LACTATED RINGERS IV SOLN
INTRAVENOUS | Status: DC
  Administered 2018-07-03 (×2): via INTRAVENOUS

## 2018-07-03 MED ORDER — OXYTOCIN 40 UNITS IN LACTATED RINGERS INFUSION - SIMPLE MED
2.5000 [IU]/h | INTRAVENOUS | Status: DC
  Filled 2018-07-03: qty 1000

## 2018-07-03 MED ORDER — OXYCODONE-ACETAMINOPHEN 5-325 MG PO TABS: 2.0000 | ORAL_TABLET | ORAL | Status: DC | PRN

## 2018-07-03 MED ORDER — FENTANYL CITRATE (PF) 100 MCG/2ML IJ SOLN
INTRAMUSCULAR | Status: AC
  Filled 2018-07-03: qty 2

## 2018-07-03 MED ORDER — OXYTOCIN BOLUS FROM INFUSION
500.0000 mL | Freq: Once | INTRAVENOUS | Status: AC
  Administered 2018-07-03: 500 mL via INTRAVENOUS

## 2018-07-03 MED ORDER — SOD CITRATE-CITRIC ACID 500-334 MG/5ML PO SOLN: 30.0000 mL | ORAL | Status: DC | PRN

## 2018-07-03 MED ORDER — FENTANYL CITRATE (PF) 100 MCG/2ML IJ SOLN
100.0000 ug | INTRAMUSCULAR | Status: DC | PRN
  Administered 2018-07-03: 100 ug via INTRAVENOUS

## 2018-07-03 NOTE — MAU Note (Addendum)
Ongoing ctx's.  Closer and stronger.  Lot of pain in low back.  Bleeding. Sent up from clinic for further eval, 4-5cm today.

## 2018-07-03 NOTE — Discharge Instructions (Signed)
Braxton Hicks Contractions °Contractions of the uterus can occur throughout pregnancy, but they are not always a sign that you are in labor. You may have practice contractions called Braxton Hicks contractions. These false labor contractions are sometimes confused with true labor. °What are Braxton Hicks contractions? °Braxton Hicks contractions are tightening movements that occur in the muscles of the uterus before labor. Unlike true labor contractions, these contractions do not result in opening (dilation) and thinning of the cervix. Toward the end of pregnancy (32-34 weeks), Braxton Hicks contractions can happen more often and may become stronger. These contractions are sometimes difficult to tell apart from true labor because they can be very uncomfortable. You should not feel embarrassed if you go to the hospital with false labor. °Sometimes, the only way to tell if you are in true labor is for your health care provider to look for changes in the cervix. The health care provider will do a physical exam and may monitor your contractions. If you are not in true labor, the exam should show that your cervix is not dilating and your water has not broken. °If there are other health problems associated with your pregnancy, it is completely safe for you to be sent home with false labor. You may continue to have Braxton Hicks contractions until you go into true labor. °How to tell the difference between true labor and false labor °True labor °· Contractions last 30-70 seconds. °· Contractions become very regular. °· Discomfort is usually felt in the top of the uterus, and it spreads to the lower abdomen and low back. °· Contractions do not go away with walking. °· Contractions usually become more intense and increase in frequency. °· The cervix dilates and gets thinner. °False labor °· Contractions are usually shorter and not as strong as true labor contractions. °· Contractions are usually irregular. °· Contractions  are often felt in the front of the lower abdomen and in the groin. °· Contractions may go away when you walk around or change positions while lying down. °· Contractions get weaker and are shorter-lasting as time goes on. °· The cervix usually does not dilate or become thin. °Follow these instructions at home: °· Take over-the-counter and prescription medicines only as told by your health care provider. °· Keep up with your usual exercises and follow other instructions from your health care provider. °· Eat and drink lightly if you think you are going into labor. °· If Braxton Hicks contractions are making you uncomfortable: °? Change your position from lying down or resting to walking, or change from walking to resting. °? Sit and rest in a tub of warm water. °? Drink enough fluid to keep your urine pale yellow. Dehydration may cause these contractions. °? Do slow and deep breathing several times an hour. °· Keep all follow-up prenatal visits as told by your health care provider. This is important. °Contact a health care provider if: °· You have a fever. °· You have continuous pain in your abdomen. °Get help right away if: °· Your contractions become stronger, more regular, and closer together. °· You have fluid leaking or gushing from your vagina. °· You pass blood-tinged mucus (bloody show). °· You have bleeding from your vagina. °· You have low back pain that you never had before. °· You feel your baby’s head pushing down and causing pelvic pressure. °· Your baby is not moving inside you as much as it used to. °Summary °· Contractions that occur before labor are called Braxton   Hicks contractions, false labor, or practice contractions. °· Braxton Hicks contractions are usually shorter, weaker, farther apart, and less regular than true labor contractions. True labor contractions usually become progressively stronger and regular and they become more frequent. °· Manage discomfort from Braxton Hicks contractions by  changing position, resting in a warm bath, drinking plenty of water, or practicing deep breathing. °This information is not intended to replace advice given to you by your health care provider. Make sure you discuss any questions you have with your health care provider. °Document Released: 12/22/2016 Document Revised: 12/22/2016 Document Reviewed: 12/22/2016 °Elsevier Interactive Patient Education © 2018 Elsevier Inc. ° °

## 2018-07-03 NOTE — Anesthesia Pain Management Evaluation Note (Signed)
  CRNA Pain Management Visit Note  Patient: Melissa Foster, 22 y.o., female  "Hello I am a member of the anesthesia team at [facility]. We have an anesthesia team available at all times to provide care throughout the hospital, including epidural management and anesthesia for C-section. I don't know your plan for the delivery whether it a natural birth, water birth, IV sedation, nitrous supplementation, doula or epidural, but we want to meet your pain goals."   1.Was your pain managed to your expectations on prior hospitalizations?   No prior hospitalizations  2.What is your expectation for pain management during this hospitalization?     Labor support without medications  3.How can we help you reach that goal? unsure  Record the patient's initial score and the patient's pain goal.   Pain: 8  Pain Goal: 10 [facility] wants you to be able to say your pain was always managed very well.  Cephus ShellingBURGER,Rubbie Goostree 07/03/2018

## 2018-07-03 NOTE — Progress Notes (Signed)
LABOR PROGRESS NOTE  Melissa Foster is a 22 y.o. G2P0010 at 7629w3d  admitted for SOL  Subjective: Patient states contractions have become very uncomfortable. Does not want epidural. AROM with clear fluid   Objective: BP 131/81   Pulse (!) 108   Temp 98.5 F (36.9 C) (Oral)   Resp 18   Wt 105.3 kg   LMP 09/18/2017   SpO2 98%   BMI 39.87 kg/m  or  Vitals:   07/03/18 1722 07/03/18 1804 07/03/18 1835 07/03/18 1852  BP: (!) 141/84 128/84  131/81  Pulse: (!) 114 (!) 123  (!) 108  Resp:   18 18  Temp:    98.5 F (36.9 C)  TempSrc:    Oral  SpO2:      Weight:        Dilation: 7.5 Effacement (%): 90 Cervical Position: Anterior Station: -1 Presentation: Vertex Exam by:: Dr. Sherran NeedsS. Natayah Warmack FHT: baseline rate 120, moderate varibility, +acel, +early decel Toco: irregular   Labs: Lab Results  Component Value Date   WBC 13.8 (H) 07/03/2018   HGB 14.2 07/03/2018   HCT 42.5 07/03/2018   MCV 88.2 07/03/2018   PLT 213 07/03/2018    Patient Active Problem List   Diagnosis Date Noted  . Indication for care in labor and delivery, antepartum 07/03/2018  . Obesity affecting pregnancy in second trimester   . Supervision of normal first pregnancy, antepartum 01/22/2018    Assessment / Plan: 22 y.o. G2P0010 at 2229w3d here for SOL  Labor: AROM  Fetal Wellbeing:  Cat 1 Pain Control:  Per patient request  Anticipated MOD:  SVD  Melissa ManisSherin Chastin Riesgo, DO PGY-2 07/03/2018, 7:53 PM

## 2018-07-03 NOTE — H&P (Addendum)
LABOR AND DELIVERY ADMISSION HISTORY AND PHYSICAL NOTE  Melissa Foster is a 22 y.o. female G2P0010 with IUP at [redacted]w[redacted]d by LMP presenting for SOL. Began feeling contractions @5AM  this morning.  She reports positive fetal movement. She denies leakage of fluid or vaginal bleeding.  Prenatal History/Complications: PNC at [facility] Pregnancy complications:  - elevated BP in clinic 11/12 (single reading of 147/83)  Sono: @[redacted]w[redacted]d , CWD, normal anatomy, cephalic presentation, posterior placental lie, 1643g, 61% EFW  Past Medical History: Past Medical History:  Diagnosis Date  . Medical history non-contributory     Past Surgical History: Past Surgical History:  Procedure Laterality Date  . NO PAST SURGERIES      Obstetrical History: OB History    Gravida  2   Para      Term      Preterm      AB  1   Living        SAB  1   TAB      Ectopic      Multiple      Live Births  0           Social History: Social History   Socioeconomic History  . Marital status: Single    Spouse name: Not on file  . Number of children: Not on file  . Years of education: Not on file  . Highest education level: Not on file  Occupational History  . Not on file  Social Needs  . Financial resource strain: Not on file  . Food insecurity:    Worry: Not on file    Inability: Not on file  . Transportation needs:    Medical: Not on file    Non-medical: Not on file  Tobacco Use  . Smoking status: Never Smoker  . Smokeless tobacco: Never Used  Substance and Sexual Activity  . Alcohol use: Not Currently    Comment: occasionally  . Drug use: No  . Sexual activity: Yes    Birth control/protection: None  Lifestyle  . Physical activity:    Days per week: Not on file    Minutes per session: Not on file  . Stress: Not on file  Relationships  . Social connections:    Talks on phone: Not on file    Gets together: Not on file    Attends religious service: Not on file    Active  member of club or organization: Not on file    Attends meetings of clubs or organizations: Not on file    Relationship status: Not on file  Other Topics Concern  . Not on file  Social History Narrative  . Not on file    Family History: Family History  Problem Relation Age of Onset  . Healthy Mother   . Hypertension Father     Allergies: No Known Allergies  Medications Prior to Admission  Medication Sig Dispense Refill Last Dose  . Prenatal Multivit-Min-Fe-FA (PRENATAL VITAMINS) 0.8 MG tablet Take 1 tablet by mouth daily. 30 tablet 12 07/03/2018 at Unknown time     Review of Systems  All systems reviewed and negative except as stated in HPI  Physical Exam Blood pressure 129/70, pulse (!) 115, temperature 98.7 F (37.1 C), temperature source Oral, resp. rate 18, weight 105.3 kg, last menstrual period 09/18/2017, SpO2 98 %, unknown if currently breastfeeding. General appearance: alert, oriented, NAD Lungs: normal respiratory effort Heart: regular rate Abdomen: soft, non-tender; gravid, FH appropriate for GA Extremities: No calf swelling or  tenderness Presentation: cephalic per MAU RN  Fetal monitoring: Baseline 140, moderate variability, +accel, -decel Uterine activity: irregular, q2-5min Dilation: 5.5 Effacement (%): 80 Station: -2 Exam by:: n druebbisch rn  Prenatal labs: ABO, Rh: --/--/O POS (11/12 1449) Antibody: PENDING (11/12 1449) Rubella: Immune (03/26 0000) RPR: Non Reactive (08/30 1017)  HBsAg: Negative (03/26 0000)  HIV: Non Reactive (08/30 1017)  GC/Chlamydia: Neg/Neg (10/22) GBS:   Neg (10/22) 2-hr GTT: 99/79/111 Genetic screening:  Low risk NIPS, neg AFP Anatomy US: normal female  Prenatal Transfer Tool  Maternal Diabetes: No Genetic Screening: Normal Maternal Ultrasounds/Referrals: Normal Fetal Ultrasounds or other Referrals:  None Maternal Substance Abuse:  No Significant Maternal Medications:  None Significant Maternal Lab Results: Lab  values include: Group B Strep negative  Results for orders placed or performed during the hospital encounter of 07/03/18 (from the past 24 hour(s))  CBC   Collection Time: 07/03/18  2:49 PM  Result Value Ref Range   WBC 13.8 (H) 4.0 - 10.5 K/uL   RBC 4.82 3.87 - 5.11 MIL/uL   Hemoglobin 14.2 12.0 - 15.0 g/dL   HCT 16.1 09.6 - 04.5 %   MCV 88.2 80.0 - 100.0 fL   MCH 29.5 26.0 - 34.0 pg   MCHC 33.4 30.0 - 36.0 g/dL   RDW 40.9 81.1 - 91.4 %   Platelets 213 150 - 400 K/uL   nRBC 0.0 0.0 - 0.2 %  Type and screen [facility]   Collection Time: 07/03/18  2:49 PM  Result Value Ref Range   ABO/RH(D) O POS    Antibody Screen PENDING    Sample Expiration      07/06/2018 Performed at [facility], [address] [place], [place] [address]     Patient Active Problem List   Diagnosis Date Noted  . Indication for care in labor and delivery, antepartum 07/03/2018  . Obesity affecting pregnancy in second trimester   . Supervision of normal first pregnancy, antepartum 01/22/2018    Assessment: Melissa Foster is a 22 y.o. G2P0010 at [redacted]w[redacted]d here for SOL  #Labor: expectant management #Pain: Per patient request, wants natural birth without pain medication  #FWB: Cat 1 #ID:  GBS meg #MOF: Breast #MOC:POPs #Circ:  no  Oralia Manis, DO PGY-2 07/03/2018, 3:50 PM  OB FELLOW HISTORY AND PHYSICAL ATTESTATION  I have seen and examined this patient; I agree with above documentation in the resident's note.   Marcy Siren, D.O. OB Fellow  07/03/2018, 4:06 PM

## 2018-07-03 NOTE — Progress Notes (Signed)
   PRENATAL VISIT NOTE  Subjective:  Melissa Foster is a 22 y.o. G2P0010 at 909w3d being seen today for ongoing prenatal care.  She is currently monitored for the following issues for this low-risk pregnancy and has Supervision of normal first pregnancy, antepartum and Obesity affecting pregnancy in second trimester on their problem list.  Patient reports contractions since  last night. Was in MAU and was 3.5; was sent home after not making changes. Has had some bloody show. .  Contractions: Irregular. Vag. Bleeding: Bloody Show.  Movement: Present. Denies leaking of fluid.   The following portions of the patient's history were reviewed and updated as appropriate: allergies, current medications, past family history, past medical history, past social history, past surgical history and problem list. Problem list updated.  Objective:   Vitals:   07/03/18 1144  BP: (!) 147/83  Pulse: (!) 112    Fetal Status: Fetal Heart Rate (bpm): 132   Movement: Present  Presentation: Vertex  General:  Alert, oriented and cooperative. Patient is in no acute distress.  Skin: Skin is warm and dry. No rash noted.   Cardiovascular: Normal heart rate noted  Respiratory: Normal respiratory effort, no problems with respiration noted  Abdomen: Soft, gravid, appropriate for gestational age.  Pain/Pressure: Present     Pelvic: Cervical exam performed Dilation: 5    Cervix was 3.5, stretched to 5 manually. 80% effaced, -2 station.   Extremities: Normal range of motion.  Edema: Trace  Mental Status: Normal mood and affect. Normal behavior. Normal judgment and thought content.   Assessment and Plan:  Pregnancy: G2P0010 at 419w3d  1. Supervision of normal first pregnancy, antepartum    -Talked with MAU provider; patient will come up for labor rule out -BP will be monitored at that time; if BP still elevated, consider admission and pre-e work up.   Term labor symptoms and general obstetric precautions  including but not limited to vaginal bleeding, contractions, leaking of fluid and fetal movement were reviewed in detail with the patient. Please refer to After Visit Summary for other counseling recommendations.  No follow-ups on file.  No future appointments.  Marylene LandKathryn Lorraine Kooistra, CNM

## 2018-07-03 NOTE — MAU Note (Signed)
Urine in lab 

## 2018-07-03 NOTE — Patient Instructions (Addendum)
Vaginal Delivery Vaginal delivery means that you will give birth by pushing your baby out of your birth canal (vagina). A team of health care providers will help you before, during, and after vaginal delivery. Birth experiences are unique for every woman and every pregnancy, and birth experiences vary depending on where you choose to give birth. What should I do to prepare for my baby's birth? Before your baby is born, it is important to talk with your health care provider about:  Your labor and delivery preferences. These may include: ? Medicines that you may be given. ? How you will manage your pain. This might include non-medical pain relief techniques or injectable pain relief such as epidural analgesia. ? How you and your baby will be monitored during labor and delivery. ? Who may be in the labor and delivery room with you. ? Your feelings about surgical delivery of your baby (cesarean delivery, or C-section) if this becomes necessary. ? Your feelings about receiving donated blood through an IV tube (blood transfusion) if this becomes necessary.  Whether you are able: ? To take pictures or videos of the birth. ? To eat during labor and delivery. ? To move around, walk, or change positions during labor and delivery.  What to expect after your baby is born, such as: ? Whether delayed umbilical cord clamping and cutting is offered. ? Who will care for your baby right after birth. ? Medicines or tests that may be recommended for your baby. ? Whether breastfeeding is supported in your hospital or birth center. ? How long you will be in the hospital or birth center.  How any medical conditions you have may affect your baby or your labor and delivery experience.  To prepare for your baby's birth, you should also:  Attend all of your health care visits before delivery (prenatal visits) as recommended by your health care provider. This is important.  Prepare your home for your baby's  arrival. Make sure that you have: ? Diapers. ? Baby clothing. ? Feeding equipment. ? Safe sleeping arrangements for you and your baby.  Install a car seat in your vehicle. Have your car seat checked by a certified car seat installer to make sure that it is installed safely.  Think about who will help you with your new baby at home for at least the first several weeks after delivery.  What can I expect when I arrive at the birth center or hospital? Once you are in labor and have been admitted into the hospital or birth center, your health care provider may:  Review your pregnancy history and any concerns you have.  Insert an IV tube into one of your veins. This is used to give you fluids and medicines.  Check your blood pressure, pulse, temperature, and heart rate (vital signs).  Check whether your bag of water (amniotic sac) has broken (ruptured).  Talk with you about your birth plan and discuss pain control options.  Monitoring Your health care provider may monitor your contractions (uterine monitoring) and your baby's heart rate (fetal monitoring). You may need to be monitored:  Often, but not continuously (intermittently).  All the time or for long periods at a time (continuously). Continuous monitoring may be needed if: ? You are taking certain medicines, such as medicine to relieve pain or make your contractions stronger. ? You have pregnancy or labor complications.  Monitoring may be done by:  Placing a special stethoscope or a handheld monitoring device on your abdomen to   check your baby's heartbeat, and feeling your abdomen for contractions. This method of monitoring does not continuously record your baby's heartbeat or your contractions.  Placing monitors on your abdomen (external monitors) to record your baby's heartbeat and the frequency and length of contractions. You may not have to wear external monitors all the time.  Placing monitors inside of your uterus  (internal monitors) to record your baby's heartbeat and the frequency, length, and strength of your contractions. ? Your health care provider may use internal monitors if he or she needs more information about the strength of your contractions or your baby's heart rate. ? Internal monitors are put in place by passing a thin, flexible wire through your vagina and into your uterus. Depending on the type of monitor, it may remain in your uterus or on your baby's head until birth. ? Your health care provider will discuss the benefits and risks of internal monitoring with you and will ask for your permission before inserting the monitors.  Telemetry. This is a type of continuous monitoring that can be done with external or internal monitors. Instead of having to stay in bed, you are able to move around during telemetry. Ask your health care provider if telemetry is an option for you.  Physical exam Your health care provider may perform a physical exam. This may include:  Checking whether your baby is positioned: ? With the head toward your vagina (head-down). This is most common. ? With the head toward the top of your uterus (head-up or breech). If your baby is in a breech position, your health care provider may try to turn your baby to a head-down position so you can deliver vaginally. If it does not seem that your baby can be born vaginally, your provider may recommend surgery to deliver your baby. In rare cases, you may be able to deliver vaginally if your baby is head-up (breech delivery). ? Lying sideways (transverse). Babies that are lying sideways cannot be delivered vaginally.  Checking your cervix to determine: ? Whether it is thinning out (effacing). ? Whether it is opening up (dilating). ? How low your baby has moved into your birth canal.  What are the three stages of labor and delivery?  Normal labor and delivery is divided into the following three stages: Stage 1  Stage 1 is the  longest stage of labor, and it can last for hours or days. Stage 1 includes: ? Early labor. This is when contractions may be irregular, or regular and mild. Generally, early labor contractions are more than 10 minutes apart. ? Active labor. This is when contractions get longer, more regular, more frequent, and more intense. ? The transition phase. This is when contractions happen very close together, are very intense, and may last longer than during any other part of labor.  Contractions generally feel mild, infrequent, and irregular at first. They get stronger, more frequent (about every 2-3 minutes), and more regular as you progress from early labor through active labor and transition.  Many women progress through stage 1 naturally, but you may need help to continue making progress. If this happens, your health care provider may talk with you about: ? Rupturing your amniotic sac if it has not ruptured yet. ? Giving you medicine to help make your contractions stronger and more frequent.  Stage 1 ends when your cervix is completely dilated to 4 inches (10 cm) and completely effaced. This happens at the end of the transition phase. Stage 2  Once   your cervix is completely effaced and dilated to 4 inches (10 cm), you may start to feel an urge to push. It is common for the body to naturally take a rest before feeling the urge to push, especially if you received an epidural or certain other pain medicines. This rest period may last for up to 1-2 hours, depending on your unique labor experience.  During stage 2, contractions are generally less painful, because pushing helps relieve contraction pain. Instead of contraction pain, you may feel stretching and burning pain, especially when the widest part of your baby's head passes through the vaginal opening (crowning).  Your health care provider will closely monitor your pushing progress and your baby's progress through the vagina during stage 2.  Your  health care provider may massage the area of skin between your vaginal opening and anus (perineum) or apply warm compresses to your perineum. This helps it stretch as the baby's head starts to crown, which can help prevent perineal tearing. ? In some cases, an incision may be made in your perineum (episiotomy) to allow the baby to pass through the vaginal opening. An episiotomy helps to make the opening of the vagina larger to allow more room for the baby to fit through.  It is very important to breathe and focus so your health care provider can control the delivery of your baby's head. Your health care provider may have you decrease the intensity of your pushing, to help prevent perineal tearing.  After delivery of your baby's head, the shoulders and the rest of the body generally deliver very quickly and without difficulty.  Once your baby is delivered, the umbilical cord may be cut right away, or this may be delayed for 1-2 minutes, depending on your baby's health. This may vary among health care providers, hospitals, and birth centers.  If you and your baby are healthy enough, your baby may be placed on your chest or abdomen to help maintain the baby's temperature and to help you bond with each other. Some mothers and babies start breastfeeding at this time. Your health care team will dry your baby and help keep your baby warm during this time.  Your baby may need immediate care if he or she: ? Showed signs of distress during labor. ? Has a medical condition. ? Was born too early (prematurely). ? Had a bowel movement before birth (meconium). ? Shows signs of difficulty transitioning from being inside the uterus to being outside of the uterus. If you are planning to breastfeed, your health care team will help you begin a feeding. Stage 3  The third stage of labor starts immediately after the birth of your baby and ends after you deliver the placenta. The placenta is an organ that develops  during pregnancy to provide oxygen and nutrients to your baby in the womb.  Delivering the placenta may require some pushing, and you may have mild contractions. Breastfeeding can stimulate contractions to help you deliver the placenta.  After the placenta is delivered, your uterus should tighten (contract) and become firm. This helps to stop bleeding in your uterus. To help your uterus contract and to control bleeding, your health care provider may: ? Give you medicine by injection, through an IV tube, by mouth, or through your rectum (rectally). ? Massage your abdomen or perform a vaginal exam to remove any blood clots that are left in your uterus. ? Empty your bladder by placing a thin, flexible tube (catheter) into your bladder. ? Encourage   you to breastfeed your baby. After labor is over, you and your baby will be monitored closely to ensure that you are both healthy until you are ready to go home. Your health care team will teach you how to care for yourself and your baby. This information is not intended to replace advice given to you by your health care provider. Make sure you discuss any questions you have with your health care provider. Document Released: 05/17/2008 Document Revised: 02/26/2016 Document Reviewed: 08/23/2015 Elsevier Interactive Patient Education  2018 [employer] Pain Relief During Labor and Delivery Many things can cause pain during labor and delivery, including:  Pressure on bones and ligaments due to the baby moving through the pelvis.  Stretching of tissues due to the baby moving through the birth canal.  Muscle tension due to anxiety or nervousness.  The uterus tightening (contracting) and relaxing to help move the baby.  There are many ways to deal with the pain of labor and delivery. They include:  Taking prenatal classes. Taking these classes helps you know what to expect during your baby's birth. What you learn will increase your confidence and  decrease your anxiety.  Practicing relaxation techniques or doing relaxing activities, such as: ? Focused breathing. ? Meditation. ? Visualization. ? Aroma therapy. ? Listening to your favorite music. ? Hypnosis.  Taking a warm shower or bath (hydrotherapy). This may: ? Provide comfort and relaxation. ? Lessen your perception of pain. ? Decrease the amount of pain medicine needed. ? Decrease the length of labor.  Getting a massage or counterpressure on your back.  Applying warm packs or ice packs.  Changing positions often, moving around, or using a birthing ball.  Getting: ? Pain medicine through an IV or injection into a muscle. ? Pain medicine inserted into your spinal column. ? Injections of sterile water just under the skin on your lower back (intradermal injections). ? Laughing gas (nitrous oxide).  Discuss your pain control options with your health care provider during your prenatal visits. Explore the options offered by your hospital or birth center. What kinds of medicine are available? There are two kinds of medicines that can be used to relieve pain during labor and delivery:  Analgesics. These medicines decrease pain without causing you to lose feeling or the ability to move your muscles.  Anesthetics. These medicines block feeling in the body and can decrease your ability to move freely.  Both of these kinds of medicine can cause minor side effects, such as nausea, trouble concentrating, and sleepiness. They can also decrease the baby's heart rate before birth and affect the baby's breathing rate after birth. For this reason, health care providers are careful about when and how much medicine is given. What are specific medicines and procedures that provide pain relief? Local Anesthetics Local anesthetics are used to numb a small area of the body. They may be used along with another kind of anesthetic or used to numb the nerves of the vagina, cervix, and perineum  during the second stage of labor. General Anesthetics General anesthetics cause you to lose consciousness so you do not feel pain. They are usually only used for an emergency cesarean delivery. General anesthetics are given through an IV tube and a mask. Pudendal Block A pudendal block is a form of local anesthetic. It may be used to relieve the pain associated with pushing or stretching of the perineum at the time of delivery or to further numb the perineum. A pudendal block is done   by injecting numbing medicine through the vaginal wall into a nerve in the pelvis. Epidural Analgesia Epidural analgesia is given through a flexible IV catheter that is inserted into the lower back. Numbing medicine is delivered continuously to the area near your spinal column nerves (epidural space). After having this type of analgesia, you may be able to move your legs but you most likely will not be able to walk. Depending on the amount of medicine given, you may lose all feeling in the lower half of your body, or you may retain some level of sensation, including the urge to push. Epidural analgesia can be used to provide pain relief for a vaginal birth. Spinal Block A spinal block is similar to epidural analgesia, but the medicine is injected into the spinal fluid instead of the epidural space. A spinal block is only given once. It starts to relieve pain quickly, but the pain relief lasts only 1-6 hours. Spinal blocks can be used for cesarean deliveries. Combined Spinal-Epidural (CSE) Block A CSE block combines the effects of a spinal block and epidural analgesia. The spinal block works quickly to block all pain. The epidural analgesia provides continuous pain relief, even after the effects of the spinal block have worn off. This information is not intended to replace advice given to you by your health care provider. Make sure you discuss any questions you have with your health care provider. Document Released:  11/24/2008 Document Revised: 01/15/2016 Document Reviewed: 12/30/2015 Elsevier Interactive Patient Education  2018 [employer]  

## 2018-07-04 LAB — RPR: RPR: NONREACTIVE

## 2018-07-04 MED ORDER — DIPHENHYDRAMINE HCL 25 MG PO CAPS: 25.0000 mg | ORAL_CAPSULE | Freq: Four times a day (QID) | ORAL | Status: DC | PRN

## 2018-07-04 MED ORDER — ACETAMINOPHEN 325 MG PO TABS
650.0000 mg | ORAL_TABLET | ORAL | Status: DC | PRN
  Administered 2018-07-04 (×2): 650 mg via ORAL
  Filled 2018-07-04 (×2): qty 2

## 2018-07-04 MED ORDER — COCONUT OIL OIL: 1.0000 "application " | TOPICAL_OIL | Status: DC | PRN

## 2018-07-04 MED ORDER — WITCH HAZEL-GLYCERIN EX PADS: 1.0000 "application " | MEDICATED_PAD | CUTANEOUS | Status: DC | PRN

## 2018-07-04 MED ORDER — DIBUCAINE 1 % RE OINT: 1.0000 "application " | TOPICAL_OINTMENT | RECTAL | Status: DC | PRN

## 2018-07-04 MED ORDER — SENNOSIDES-DOCUSATE SODIUM 8.6-50 MG PO TABS
2.0000 | ORAL_TABLET | ORAL | Status: DC
  Administered 2018-07-04 (×2): 2 via ORAL
  Filled 2018-07-04 (×2): qty 2

## 2018-07-04 MED ORDER — ONDANSETRON HCL 4 MG/2ML IJ SOLN: 4.0000 mg | INTRAMUSCULAR | Status: DC | PRN

## 2018-07-04 MED ORDER — MEASLES, MUMPS & RUBELLA VAC IJ SOLR
0.5000 mL | Freq: Once | INTRAMUSCULAR | Status: DC
  Filled 2018-07-04: qty 0.5

## 2018-07-04 MED ORDER — TETANUS-DIPHTH-ACELL PERTUSSIS 5-2.5-18.5 LF-MCG/0.5 IM SUSP: 0.5000 mL | Freq: Once | INTRAMUSCULAR | Status: DC

## 2018-07-04 MED ORDER — ONDANSETRON HCL 4 MG PO TABS: 4.0000 mg | ORAL_TABLET | ORAL | Status: DC | PRN

## 2018-07-04 MED ORDER — ZOLPIDEM TARTRATE 5 MG PO TABS: 5.0000 mg | ORAL_TABLET | Freq: Every evening | ORAL | Status: DC | PRN

## 2018-07-04 MED ORDER — IBUPROFEN 600 MG PO TABS
600.0000 mg | ORAL_TABLET | Freq: Four times a day (QID) | ORAL | Status: DC
  Administered 2018-07-04 – 2018-07-05 (×7): 600 mg via ORAL
  Filled 2018-07-04 (×7): qty 1

## 2018-07-04 MED ORDER — SIMETHICONE 80 MG PO CHEW: 80.0000 mg | CHEWABLE_TABLET | ORAL | Status: DC | PRN

## 2018-07-04 MED ORDER — PRENATAL MULTIVITAMIN CH
1.0000 | ORAL_TABLET | Freq: Every day | ORAL | Status: DC
  Administered 2018-07-04 – 2018-07-05 (×2): 1 via ORAL
  Filled 2018-07-04 (×2): qty 1

## 2018-07-04 MED ORDER — BENZOCAINE-MENTHOL 20-0.5 % EX AERO
1.0000 "application " | INHALATION_SPRAY | CUTANEOUS | Status: DC | PRN
  Administered 2018-07-04: 1 via TOPICAL
  Filled 2018-07-04 (×2): qty 56

## 2018-07-04 NOTE — Lactation Note (Signed)
This note was copied from a baby's chart. Lactation Consultation Note  Patient Name: Melissa Ardine BjorkMargarita Aviles Rodriges UJWJX'BToday's Date: 07/04/2018 Reason for consult: Initial assessment;1st time breastfeeding;Term P1, 8 hour female infant. Per mom,  She mostly have  been using formula, she  thought she did not have milk present in breast.  LC asked mom  to hand express and  Mom has colostrum present in both breast. Unable latch infant at this time due mom giving infant formula prior to [facility] entering the room. LC notice mom has flat nipples, mom fitted with breast shells and shown how to use, mom aware not to sleep in breast shells. LC dicussed with mom doing nipple roll, or pre-pumping to help extended nipple shaft out more prior to latching infant to breast. Mom will use DEBP after latching infant to breast to help with breast stimulation and induction, mom knows to pump 15 minutes on initial setting. Per mom, she wants to BF more and use less formula moving forward. Mom will ask Nurse or LC for assistance with latching infant to breast. LC discussed I & O. Mom made aware of O/P services, breastfeeding support groups, community resources, and our phone # for post-discharge questions.   Maternal Data Formula Feeding for Exclusion: No Has patient been taught Hand Expression?: Yes(Mom hand expressed colostrumpresent both breast. ) Does the patient have breastfeeding experience prior to this delivery?: No  Feeding Feeding Type: Bottle Fed - Formula Nipple Type: Slow - flow  LATCH Score                   Interventions Interventions: Breast feeding basics reviewed;Skin to skin;Hand express;DEBP;Shells  Lactation Tools Discussed/Used Tools: Shells WIC Program: No Pump Review: Setup, frequency, and cleaning;Milk Storage Initiated by:: Danelle Earthlyobin Kaleiyah Polsky, IBCLC Date initiated:: 07/04/18   Consult Status      Danelle EarthlyRobin Rohn Fritsch 07/04/2018, 6:26 AM

## 2018-07-04 NOTE — Progress Notes (Signed)
POSTPARTUM PROGRESS NOTE  Post Partum Day 1  Subjective:  Melissa Foster is a 22 y.o. G2P1011 s/p SVD at 2175w3d.  She reports she is doing well. No acute events overnight. No problems with voiding or PO intake. Pt does note she has not had very much urine output since delivery. Not passing gas. No BMs. Denies nausea or vomiting.  Pain is well controlled.  Lochia is slowing. No problems with ambulation, including SOB, dizziness, HA.  Objective: Blood pressure 111/60, pulse 84, temperature 98.3 F (36.8 C), temperature source Oral, resp. rate 18, weight 105.3 kg, last menstrual period 09/18/2017, SpO2 98 %, unknown if currently breastfeeding.  Physical Exam:  General: alert, cooperative and no distress Chest: no respiratory distress Heart:regular rate, distal pulses intact Abdomen: soft, nontender,  Uterine Fundus: firm, appropriately tender DVT Evaluation: No calf swelling or tenderness Extremities: No edema Skin: warm, dry  Recent Labs    07/03/18 1449  HGB 14.2  HCT 42.5    Assessment/Plan: Melissa Foster is a 22 y.o. G2P1011 s/p SVD at 3775w3d   PPD#1 - Doing well. Continue OOB today and monitor for BM.  Routine postpartum care  Contraception: POPs Feeding: Breast w/bottle supplementation Dispo: Plan for discharge tomorrow   LOS: 1 day   Raul DelFrancie Jenkins, MS3 07/04/2018, 7:21 AM   OB FELLOW POSTPARTUM PROGRESS NOTE ATTESTATION  I have seen and examined this patient and agree with above documentation in the student's note.   Gwenevere AbbotNimeka Nala Kachel, MD OB Fellow  07/04/2018, 6:00 PM

## 2018-07-05 LAB — BIRTH TISSUE RECOVERY COLLECTION (PLACENTA DONATION)

## 2018-07-05 MED ORDER — IBUPROFEN 600 MG PO TABS: 600.0000 mg | ORAL_TABLET | Freq: Four times a day (QID) | ORAL | 0 refills | Status: DC

## 2018-07-05 NOTE — Discharge Instructions (Signed)

## 2018-07-05 NOTE — Lactation Note (Signed)
This note was copied from a baby's chart. Lactation Consultation Note  Patient Name: Melissa Foster [id number] Date: 07/05/2018 Reason for consult: Follow-up assessment;1st time breastfeeding;Primapara;Term  92 hours old FT female who is being exclusively formula fed by his mother, she's a P1. Mom changed her mind prior her discharge and she decided she wanted to give BF a try. LC assisted with latch, took baby to the bassinet, changed his diaper (1 void) and then took him to mother's breast STS he latched easily in football position, but no audible swallows heard at this point. Baby latched for 5 minutes before falling asleep, documented in Flowsheets.  LC noticed that mom has short shafted nipples when she was assisting with hand expression, no colostrum was seen. Mom already has breastshells to take home, reminded her to wear them in between feedings. She hasn't been pumping consistently either, she only pumped once during her hospital stay; baby is being fed 100% Gerber gentle. Reviewed engorgement prevention and treatment. LC showed mom how to convert her DEBP kit into a hand pump. Reviewed discharge instructions and treatment/prevention for sore nipples.  Feeding plan:  1. Encouraged mom to feed baby at the breast STS 8-12 times/24 hours or sooner if feeding cues are present 2. Mom will try to priorizing feedings at the breast over formula feedings 3. Hand expression or pumping (mom voiced interested in pumping and bottle too) was also encouraged  Mom reported all questions and concerns were answered, she is aware of [place] OP services and will contact PRN if any questions or concerns arise.     Maternal Data    Feeding Feeding Type: Breast Fed Nipple Type: Slow - flow  LATCH Score Latch: Grasps breast easily, tongue down, lips flanged, rhythmical sucking.  Audible Swallowing: None  Type of Nipple: Everted at rest and after stimulation(very short shafted)  Comfort  (Breast/Nipple): Soft / non-tender  Hold (Positioning): Assistance needed to correctly position infant at breast and maintain latch.  LATCH Score: 7  Interventions Interventions: Breast feeding basics reviewed;Assisted with latch;Skin to skin;Breast massage;Hand express;Breast compression;Adjust position;Reverse pressure;DEBP;Shells;Support pillows  Lactation Tools Discussed/Used Tools: Pump;Shells Breast pump type: Double-Electric Breast Pump   Consult Status Consult Status: Complete Date: 07/05/18 Follow-up type: Call as needed    [place] 07/05/2018, 11:52 AM

## 2018-07-05 NOTE — Discharge Summary (Addendum)
Postpartum Discharge Summary     Patient Name: Melissa Foster DOB: January 30, 1996 MRN: [id number]  Date of admission: 07/03/2018 Delivering Provider: Burman Nieves A   Date of discharge: 07/05/2018  Admitting diagnosis: labor evaluation Intrauterine pregnancy: [redacted]w[redacted]d     Secondary diagnosis:  Active Problems:   Supervision of normal first pregnancy, antepartum   Obesity affecting pregnancy in second trimester   Indication for care in labor and delivery, antepartum  Additional problems: elevated BP during clinic visit on 07/03/18     Discharge diagnosis: Term Pregnancy Delivered                                                                                                Post partum procedures:none  Augmentation: AROM  Complications: None  Hospital course:  Onset of Labor With Vaginal Delivery     22 y.o. yo G2P1011 at [redacted]w[redacted]d was admitted in Active Labor on 07/03/2018. Patient had an uncomplicated labor course as follows:  Membrane Rupture Time/Date: 7:36 PM ,07/03/2018   Intrapartum Procedures: Episiotomy: None [1]                                         Lacerations:  2nd degree [3];Perineal [11];Periurethral [8]  Patient had a delivery of a Viable infant. 07/03/2018  Information for the patient's newborn:  Jeanetta, Alonzo [[id number]]  Delivery Method: Vaginal, Spontaneous(Filed from Delivery Summary)    Pateint had an uncomplicated postpartum course.  She had some elevated BPs in transition and immediately PP, but has been normotensive during her PP stay. She is ambulating, tolerating a regular diet, passing flatus, and urinating well. Patient is discharged home in stable condition on 07/05/18.   Magnesium Sulfate recieved: No BMZ received: No  Physical exam  Vitals:   07/04/18 0800 07/04/18 1423 07/04/18 2230 07/05/18 0529  BP: (!) 102/49 129/87 115/74 104/60  Pulse: 72 76 83 67  Resp: 18 16  18   Temp: 97.8 F (36.6 C) (!) 97.5 F (36.4  C) 97.8 F (36.6 C) 97.7 F (36.5 C)  TempSrc: Oral Oral Oral Oral  SpO2:  98% 100% 99%  Weight:       General: alert, cooperative and no distress Lochia: appropriate Uterine Fundus: firm Incision: N/A DVT Evaluation: No evidence of DVT seen on physical exam. No cords or calf tenderness. No significant calf/ankle edema. Labs: Lab Results  Component Value Date   WBC 13.8 (H) 07/03/2018   HGB 14.2 07/03/2018   HCT 42.5 07/03/2018   MCV 88.2 07/03/2018   PLT 213 07/03/2018   No flowsheet data found.  Discharge instruction: per After Visit Summary and "Baby and Me Booklet".  After visit meds:  Allergies as of 07/05/2018   No Known Allergies     Medication List    TAKE these medications   ibuprofen 600 MG tablet Commonly known as:  ADVIL,MOTRIN Take 1 tablet (600 mg total) by mouth every 6 (six) hours.   Prenatal Vitamins 0.8 MG tablet Take 1 tablet  by mouth daily.       Diet: routine diet  Activity: Advance as tolerated. Pelvic rest for 6 weeks.   Outpatient follow up:4 weeks Follow up Appt: Future Appointments  Date Time Provider Department Center  08/09/2018 10:55 AM Allie Bossierove, Myra C, MD [facility]   Follow up Visit: Follow-up Information    [facility]   Specialty:  Obstetrics and Gynecology Why:  @10 :55AM Contact information: [address] [place] [id number] [phone]           Please schedule this patient for Postpartum visit in: 4 weeks with the following provider: MD For C/S patients schedule nurse incision check in weeks 2 weeks: no Low risk pregnancy complicated by: single elevated BP reading Delivery mode:  SVD Anticipated Birth Control:  POPs PP Procedures needed: BP check  Schedule Integrated BH visit: no      Newborn Data: Live born female  Birth Weight: 6 lb 11.9 oz (3059 g) APGAR: 8, 9  Newborn Delivery   Birth date/time:  07/03/2018 22:18:00 Delivery  type:  Vaginal, Spontaneous     Baby Feeding: Breast Disposition:home with mother   07/05/2018 Oralia ManisSherin Abraham, DO  PGY-2  CNM attestation I have seen and examined this patient and agree with above documentation in the resident's note.   Ardine BjorkMargarita Aviles Rodriges is a 22 y.o. G2P1011 s/p SVD.   Pain is well controlled.  Plan for birth control is oral progesterone-only contraceptive.  Method of Feeding: breast  PE:  BP 104/60 (BP Location: Right Arm)   Pulse 67   Temp 97.7 F (36.5 C) (Oral)   Resp 18   Wt 105.3 kg   LMP 09/18/2017   SpO2 99%   Breastfeeding? Unknown   BMI 39.87 kg/m  Fundus firm  Recent Labs    07/03/18 1449  HGB 14.2  HCT 42.5     Plan: discharge today - postpartum care discussed - f/u clinic in 1 week for BP check, then 4wks for postpartum visit   Cam HaiSHAW, Jaylun Fleener, CNM 9:21 AM  07/05/2018

## 2018-08-06 ENCOUNTER — Ambulatory Visit (INDEPENDENT_AMBULATORY_CARE_PROVIDER_SITE_OTHER): Payer: Medicaid Other | Admitting: Family Medicine

## 2018-08-06 ENCOUNTER — Encounter: Payer: Self-pay | Admitting: Family Medicine

## 2018-08-06 ENCOUNTER — Other Ambulatory Visit (HOSPITAL_COMMUNITY)
Admission: RE | Admit: 2018-08-06 | Discharge: 2018-08-06 | Disposition: A | Discharge status: Home / Self Care | Payer: Medicaid Other | Source: Ambulatory Visit | Attending: Obstetrics & Gynecology | Admitting: Obstetrics & Gynecology

## 2018-08-06 DIAGNOSIS — Z1389 Encounter for screening for other disorder: Secondary | ICD-10-CM

## 2018-08-06 DIAGNOSIS — Z30017 Encounter for initial prescription of implantable subdermal contraceptive: Secondary | ICD-10-CM

## 2018-08-06 DIAGNOSIS — N898 Other specified noninflammatory disorders of vagina: Secondary | ICD-10-CM | POA: Insufficient documentation

## 2018-08-06 LAB — POCT PREGNANCY, URINE: PREG TEST UR: NEGATIVE

## 2018-08-06 MED ORDER — ETONOGESTREL 68 MG ~~LOC~~ IMPL
68.0000 mg | DRUG_IMPLANT | Freq: Once | SUBCUTANEOUS | Status: AC
  Administered 2018-08-06: 68 mg via SUBCUTANEOUS

## 2018-08-06 MED ORDER — POLYETHYLENE GLYCOL 3350 17 GM/SCOOP PO POWD: 17.0000 g | Freq: Every day | ORAL | 3 refills | Status: DC

## 2018-08-06 NOTE — Progress Notes (Signed)
Subjective:     Melissa Foster is a 22 y.o. female who presents for a postpartum visit. She is 4 weeks 2 days postpartum following a spontaneous vaginal delivery. I have fully reviewed the prenatal and intrapartum course. The delivery was at 39wks 3days gestational weeks. Outcome: spontaneous vaginal delivery. Anesthesia: IV sedation. Postpartum course has been umcomplicated. Baby's course has been uncomplicated. Baby is feeding by formula-Gerber Good Start. Bleeding no bleeding. Bowel function is normal. Bladder function is normal. Patient is not sexually active. Contraception method is none. Wants nexplanon today. Postpartum depression screening: negative.   Review of Systems A comprehensive review of systems was negative.   Objective:    BP (!) 106/56   Pulse (!) 59   Wt 207 lb (93.9 kg)   Breastfeeding Yes   BMI 35.53 kg/m   General:  alert, cooperative, appears stated age and no distress  Lungs: nonlabored breathing  Heart:  RRR noted  Abdomen: soft, non-tender; bowel sounds normal; no masses,  no organomegaly   Vulva:  normal  Vagina: normal vagina, small amount of white discharge. No exudate, lesion, or erythema  Neuro/Psych: Alert and oriented x 4. No gross neuro deficits.         Assessment:    Normal postpartum exam. Pap smear done at today's visit.   Plan:    1. Contraception: Nexplanon  GYNECOLOGY OFFICE PROCEDURE NOTE  Melissa Foster is a 22 y.o. G2P1011 here for postpartum visit and Nexplanon insertion.  Last pap smear was today.  Nexplanon Insertion Procedure Patient identified, informed consent performed, consent signed.   Patient does understand that irregular bleeding is a very common side effect of this medication. She was advised to have backup contraception for one week after placement. Pregnancy test in clinic today was negative.  Appropriate time out taken.  Patient's left arm was prepped and draped in the usual sterile fashion. The  ruler used to measure and mark insertion area.  Patient was prepped with alcohol swab and then injected with 3 ml of 1% lidocaine.  She was prepped with betadine, Nexplanon removed from packaging,  Device confirmed in needle, then inserted full length of needle and withdrawn per handbook instructions. Nexplanon was able to palpated in the patient's arm; patient palpated the insert herself. There was minimal blood loss.  Patient insertion site covered with guaze and a pressure bandage to reduce any bruising.  The patient tolerated the procedure well and was given post procedure instructions.    2. Pap done today. 3. Follow up in: 3 years or as needed.

## 2018-08-06 NOTE — Progress Notes (Signed)
Pt does have some white vag d/c with odor and itching for 3wks.

## 2018-08-09 ENCOUNTER — Ambulatory Visit: Payer: Medicaid Other | Admitting: Obstetrics & Gynecology

## 2018-08-09 LAB — CYTOLOGY - PAP
Bacterial vaginitis: POSITIVE — AB
Candida vaginitis: NEGATIVE
Diagnosis: NEGATIVE

## 2018-08-10 ENCOUNTER — Other Ambulatory Visit: Payer: Self-pay | Admitting: Family Medicine

## 2018-08-10 MED ORDER — METRONIDAZOLE 500 MG PO TABS: 500.0000 mg | ORAL_TABLET | Freq: Two times a day (BID) | ORAL | 0 refills | Status: DC

## 2018-08-10 NOTE — Progress Notes (Signed)
Called patient to notify her of lab results. Positive for BV. Sent flagyl to pharmacy, which pt confirmed by phone. Questions answered.   Gwenevere AbbotNimeka Mesiah Manzo, MD  08/10/2018 3:18 PM

## 2018-09-10 ENCOUNTER — Ambulatory Visit (INDEPENDENT_AMBULATORY_CARE_PROVIDER_SITE_OTHER): Payer: Medicaid Other | Admitting: Nurse Practitioner

## 2018-09-10 ENCOUNTER — Other Ambulatory Visit (HOSPITAL_COMMUNITY)
Admission: RE | Admit: 2018-09-10 | Discharge: 2018-09-10 | Disposition: A | Discharge status: Home / Self Care | Payer: Medicaid Other | Source: Ambulatory Visit | Attending: Nurse Practitioner | Admitting: Nurse Practitioner

## 2018-09-10 ENCOUNTER — Encounter: Payer: Self-pay | Admitting: Nurse Practitioner

## 2018-09-10 VITALS — BP 111/65 | HR 66 | Wt 205.0 lb

## 2018-09-10 DIAGNOSIS — R1030 Lower abdominal pain, unspecified: Secondary | ICD-10-CM

## 2018-09-10 DIAGNOSIS — Z3202 Encounter for pregnancy test, result negative: Secondary | ICD-10-CM | POA: Diagnosis not present

## 2018-09-10 MED ORDER — MEGESTROL ACETATE 40 MG PO TABS: ORAL_TABLET | ORAL | 1 refills | Status: DC

## 2018-09-10 NOTE — Progress Notes (Signed)
   GYNECOLOGY OFFICE VISIT NOTE   History:  23 y.o. G2P1011 here today for vaginal bleeding and lower abdominal pain. She denies any abnormal vaginal discharge, bleeding, pelvic pain or other concerns.  She had Nexplanon inserted at her postpartum visit and has had periodic bleeding since then.  Had Nexplanon previously before a pregnancy and did not have any trouble with irregular vaginal bleeding.  Has had lower abdominal pain all across the lower abdomen.  Has not taken any medication for this pain.  Past Medical History:  Diagnosis Date  . Medical history non-contributory     Past Surgical History:  Procedure Laterality Date  . NO PAST SURGERIES      The following portions of the patient's history were reviewed and updated as appropriate: allergies, current medications, past family history, past medical history, past social history, past surgical history and problem list.   Health Maintenance:  Normal pap on 08-06-18.   Review of Systems:  Pertinent items noted in HPI and remainder of comprehensive ROS otherwise negative. Denies any fever, nausea or vomiting.  Objective:  Physical Exam BP 111/65   Pulse 66   Wt 205 lb (93 kg)   Breastfeeding No   BMI 35.19 kg/m  CONSTITUTIONAL: Well-developed, well-nourished female in no acute distress.  HENT:  Normocephalic, atraumatic. External right and left ear normal.  NECK: Normal range of motion, supple, SKIN: Skin is warm and dry. No rash noted. Not diaphoretic. No erythema. No pallor. NEUROLOGIC: Alert and oriented to person, place, and time. Normal muscle tone coordination. No cranial nerve deficit noted. PSYCHIATRIC: Normal mood and affect. Normal behavior. Normal judgment and thought content. ABDOMEN: Bowel sounds in all 4 quadrants.  Soft, no distention noted.  Is mildly tender all across the lower abdomen. MUSCULOSKELETAL: Normal range of motion. No edema noted.  Labs and Imaging Urine pregnancy test is  negative.  Assessment & Plan:  Abnormal vaginal bleeding Prescribed megace 40 mg take 2 tablets by mouth twice a day.  Taper down as the bleeding stops. Will do GC/Chlam due to the lower abdominal pain.  Routine preventative health maintenance measures emphasized. Please refer to After Visit Summary for other counseling recommendations.   No follow-ups on file.  Call for an appointment if needed.   Total face-to-face time with patient: 15 minutes.  Over 50% of encounter was spent on counseling and coordination of care.  Nolene Bernheim, RN, MSN, NP-BC Nurse Practitioner, [facility] for [employer], [facility] 09/10/2018 6:44 PM

## 2018-09-10 NOTE — Progress Notes (Signed)
Pain in lower abd since last Thurs, esp at night. Cramping pain "like I am going to give birth" Having scant amt of bleeding today. Bleeding has been off and on since svd 07/03/18. Has Nexplanon since pp visit

## 2018-09-11 LAB — POCT PREGNANCY, URINE: Preg Test, Ur: NEGATIVE

## 2018-09-12 LAB — GC/CHLAMYDIA PROBE AMP (~~LOC~~) NOT AT ARMC
Chlamydia: NEGATIVE
Neisseria Gonorrhea: NEGATIVE

## 2019-11-04 ENCOUNTER — Ambulatory Visit: Payer: Medicaid Other

## 2020-07-30 IMAGING — US US MFM OB FOLLOW-UP
1 series · 14 of 28 positions shown · non-contrast
Comparison: none

[Series 1: us mfm ob follow-up · 48 acquisitions, 14 frames shown]
[im 2/48]
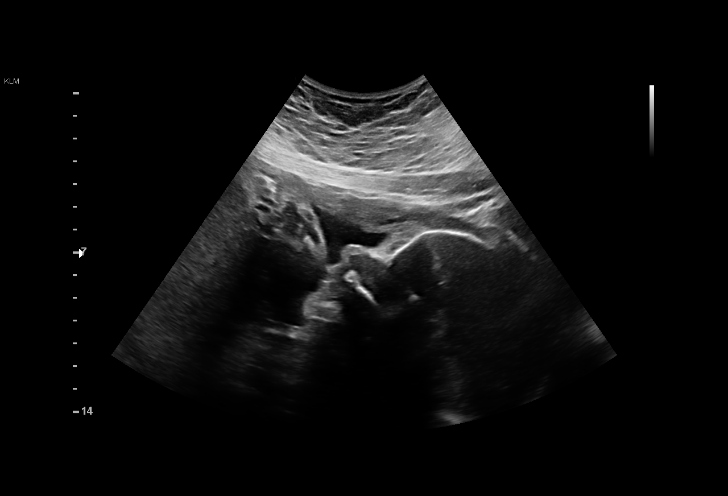
[im 6/48]
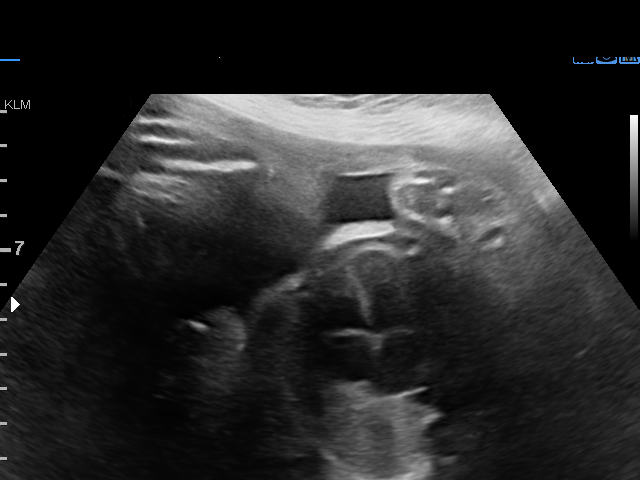
[im 9/48]
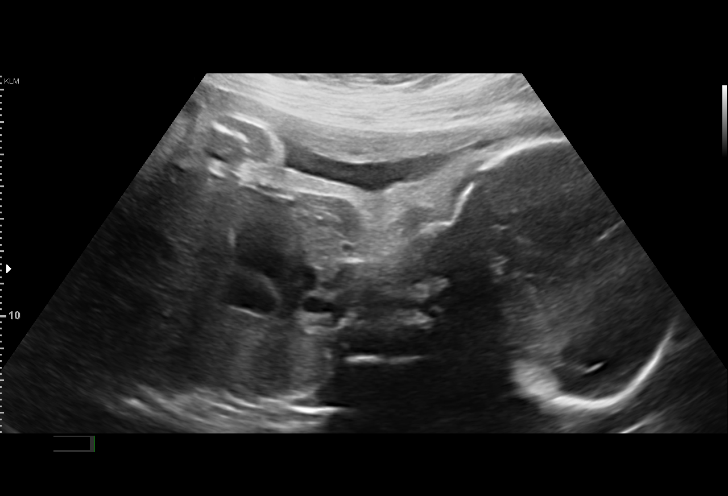
[im 13/48]
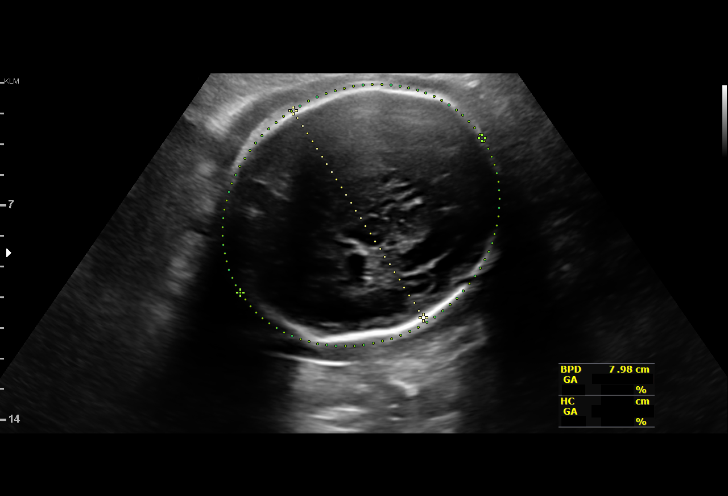
[im 16/48]
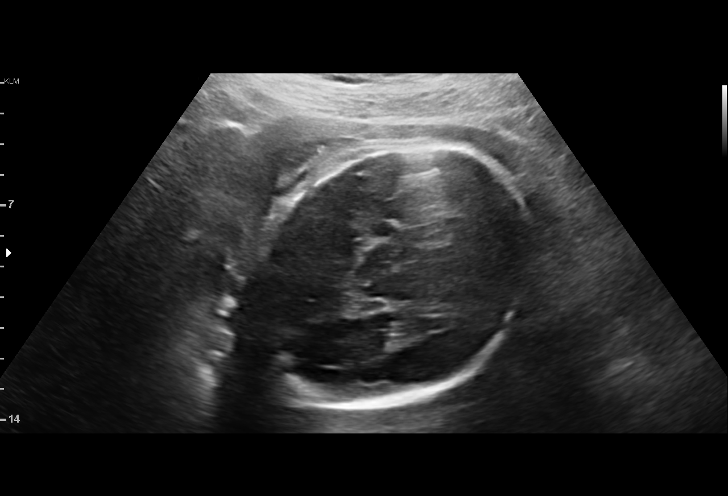
[im 20/48]
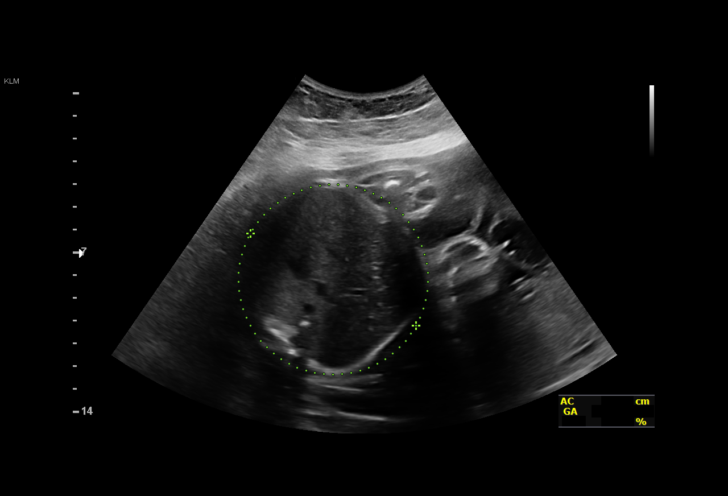
[im 23/48]
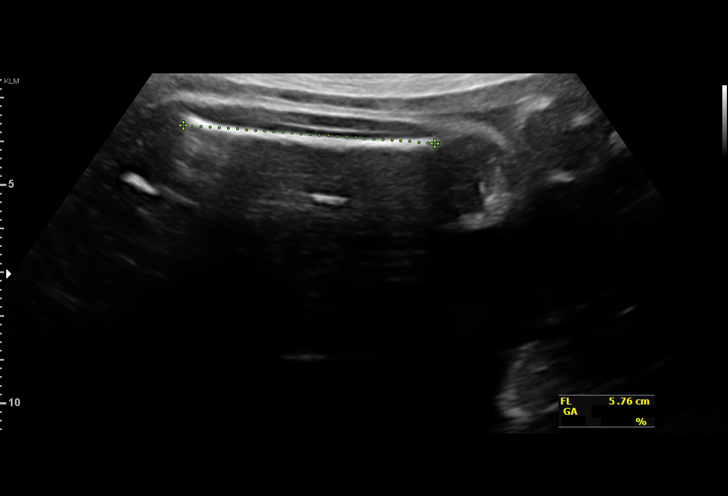
[im 27/48]
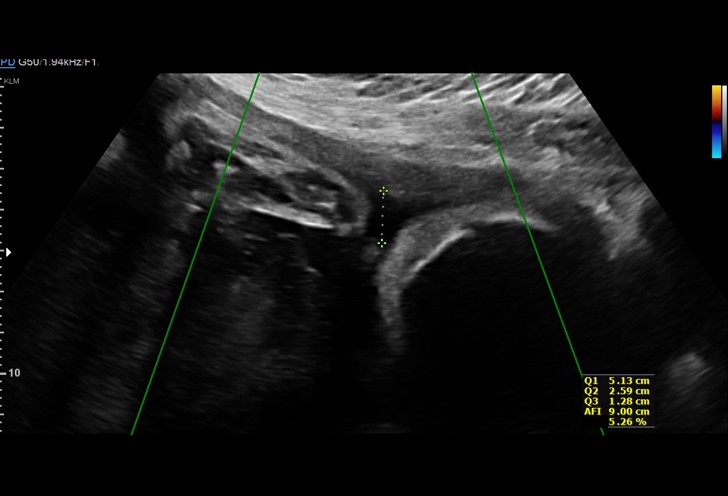
[im 30/48]
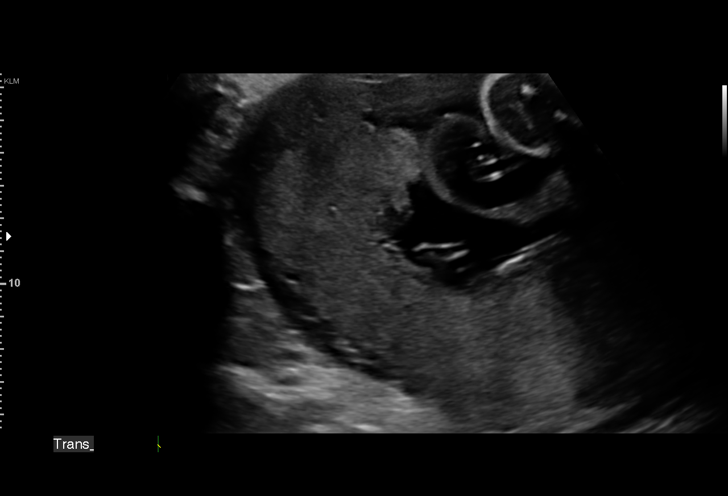
[im 34/48]
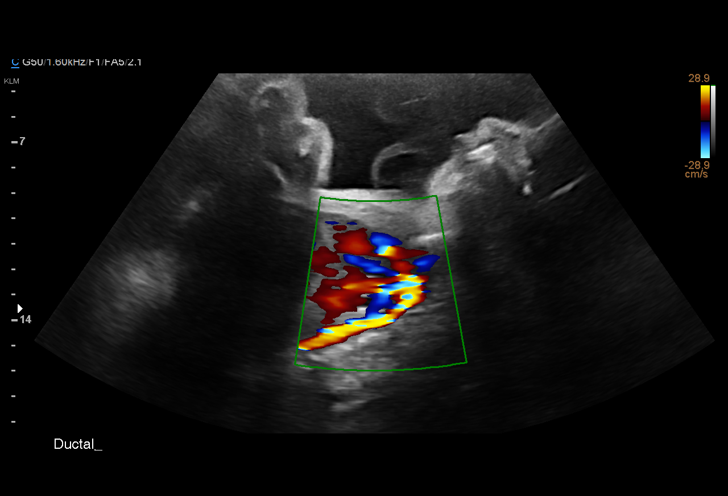
[im 37/48]
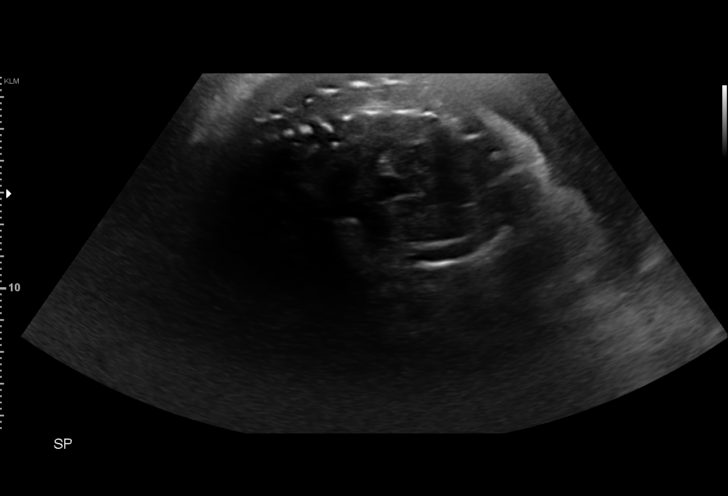
[im 41/48]
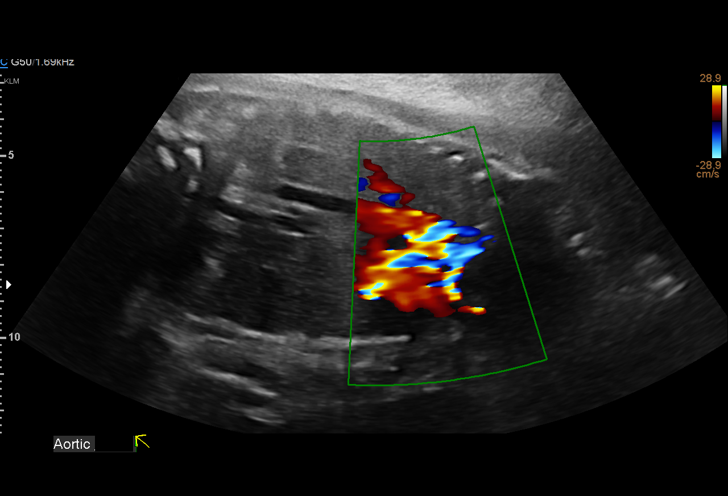
[im 44/48]
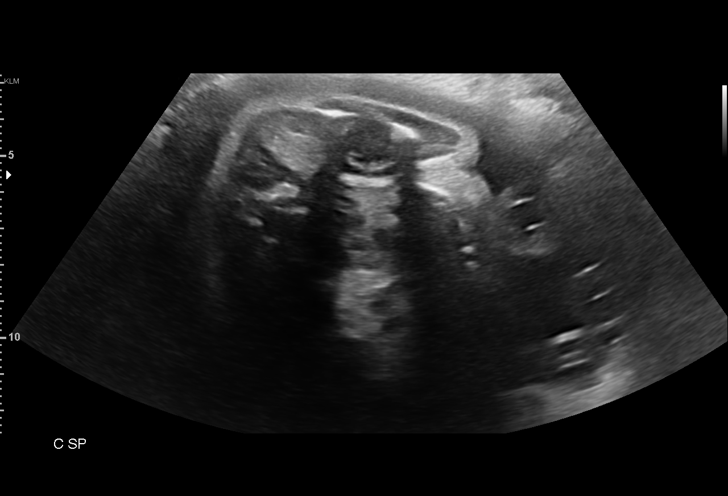
[im 48/48]
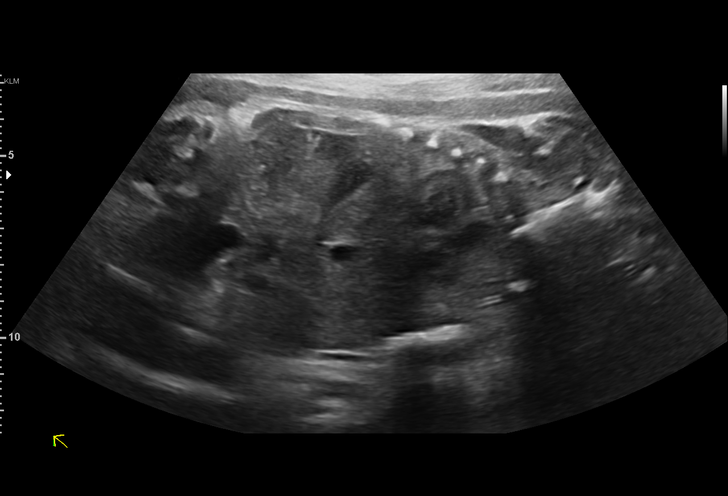

[14 of 28 positions shown; findings below may reference images not displayed]

SIMYS

Indications

Antenatal follow-up for nonvisualized fetal
anatomy
30 weeks gestation of pregnancy
Large for gestational age fetus affecting
management of mother
Vital Signs

Height:        5'6"
Fetal Evaluation

Num Of Fetuses:         1
Fetal Heart Rate(bpm):  142
Cardiac Activity:       Observed
Presentation:           Cephalic
Placenta:               Posterior
P. Cord Insertion:      Previously Visualized

Amniotic Fluid
AFI FV:      Within normal limits

AFI Sum(cm)     %Tile       Largest Pocket(cm)
10.44           17

RUQ(cm)       RLQ(cm)       LUQ(cm)        LLQ(cm)
5.13
Biometry

BPD:      80.3  mm     G. Age:  32w 2d         88  %    CI:        81.69   %    70 - 86
FL/HC:      20.7   %    19.2 -
HC:      280.4  mm     G. Age:  30w 5d         23  %    HC/AC:      1.05        0.99 -
AC:      267.6  mm     G. Age:  30w 6d         59  %    FL/BPD:     72.4   %    71 - 87
FL:       58.1  mm     G. Age:  30w 2d         34  %    FL/AC:      21.7   %    20 - 24

Est. FW:    2423  gm    3 lb 10 oz      61  %
OB History

Gravidity:    2         Term:   0        Prem:   0        SAB:   1
TOP:          0       Ectopic:  0        Living: 0
Gestational Age

LMP:           32w 1d        Date:  09/18/17                 EDD:   06/25/18
U/S Today:     31w 0d                                        EDD:   07/03/18
Best:          30w 3d     Det. By:  U/S  (01/29/18)          EDD:   07/07/18
Anatomy

Cranium:               Appears normal         Aortic Arch:            Appears normal
Cavum:                 Appears normal         Ductal Arch:            Appears normal
Ventricles:            Appears normal         Diaphragm:              Appears normal
Choroid Plexus:        Appears normal         Stomach:                Appears normal, left
sided
Cerebellum:            Appears normal         Abdomen:                Appears normal
Posterior Fossa:       Appears normal         Abdominal Wall:         Appears nml (cord
insert, abd wall)
Nuchal Fold:           Appears normal         Cord Vessels:           Appears normal (3
vessel cord)
Face:                  Appears normal         Kidneys:                Appear normal
(orbits and profile)
Lips:                  Appears normal         Bladder:                Appears normal
Thoracic:              Appears normal         Spine:                  Ltd views no
intracranial signs of
NT
Heart:                 Appears normal         Upper Extremities:      Appears normal
(4CH, axis, and situs
RVOT:                  Not well visualized    Lower Extremities:      Appears normal
LVOT:                  Appears normal

Other:  Fetus appears to be a male. Heels visualized. Nasal bone
visualized.Technically difficult due to maternal habitus and early GA.
Cervix Uterus Adnexa

Cervix
Not visualized (advanced GA >69wks)
Impression

Patient returned for completion of fetal anatomy. Amniotic
fluid is normal and good fetal activity is seen. Fetal growth is
appropriate for gestational age. Fetal anatomical survey is
complete.
Recommendations

Follow-up scans as clinically indicated.

## 2021-06-09 ENCOUNTER — Other Ambulatory Visit: Payer: Self-pay

## 2021-06-09 ENCOUNTER — Ambulatory Visit (INDEPENDENT_AMBULATORY_CARE_PROVIDER_SITE_OTHER): Payer: BC Managed Care – PPO

## 2021-06-09 DIAGNOSIS — Z3201 Encounter for pregnancy test, result positive: Secondary | ICD-10-CM

## 2021-06-09 DIAGNOSIS — Z3200 Encounter for pregnancy test, result unknown: Secondary | ICD-10-CM

## 2021-06-09 LAB — POCT URINE PREGNANCY: Preg Test, Ur: POSITIVE — AB

## 2021-06-09 MED ORDER — VITAFOL ULTRA 29-0.6-0.4-200 MG PO CAPS: 1.0000 | ORAL_CAPSULE | Freq: Every day | ORAL | 12 refills | Status: AC

## 2021-06-09 NOTE — Progress Notes (Signed)
Patient was assessed and managed by nursing staff during this encounter. I have reviewed the chart and agree with the documentation and plan. I have also made any necessary editorial changes.  Sherine Cortese A Kostas Marrow, MD 06/09/2021 9:07 PM   

## 2021-06-09 NOTE — Progress Notes (Signed)
..  Melissa Foster presents today for UPT. She has no unusual complaints. LMP:04-11-21    OBJECTIVE: Appears well, in no apparent distress.  OB History     Gravida  3   Para  1   Term  1   Preterm      AB  1   Living  1      SAB  1   IAB      Ectopic      Multiple  0   Live Births  1          Home UPT Result:Positive In-Office UPT result:Positive I have reviewed the patient's medical, obstetrical, social, and family histories, and medications.   ASSESSMENT: Positive pregnancy test  PLAN Prenatal care to be completed at: [facility]

## 2021-06-16 ENCOUNTER — Ambulatory Visit (INDEPENDENT_AMBULATORY_CARE_PROVIDER_SITE_OTHER): Payer: BC Managed Care – PPO

## 2021-06-16 ENCOUNTER — Ambulatory Visit: Payer: BC Managed Care – PPO

## 2021-06-16 ENCOUNTER — Other Ambulatory Visit: Payer: Self-pay

## 2021-06-16 VITALS — BP 124/74 | HR 100 | Ht 64.0 in | Wt 188.0 lb

## 2021-06-16 DIAGNOSIS — O3680X0 Pregnancy with inconclusive fetal viability, not applicable or unspecified: Secondary | ICD-10-CM

## 2021-06-16 DIAGNOSIS — Z3481 Encounter for supervision of other normal pregnancy, first trimester: Secondary | ICD-10-CM

## 2021-06-16 DIAGNOSIS — Z3A01 Less than 8 weeks gestation of pregnancy: Secondary | ICD-10-CM

## 2021-06-16 DIAGNOSIS — Z3491 Encounter for supervision of normal pregnancy, unspecified, first trimester: Secondary | ICD-10-CM | POA: Insufficient documentation

## 2021-06-16 DIAGNOSIS — Z3480 Encounter for supervision of other normal pregnancy, unspecified trimester: Secondary | ICD-10-CM | POA: Insufficient documentation

## 2021-06-16 MED ORDER — BLOOD PRESSURE KIT DEVI: 1.0000 | 0 refills | Status: DC

## 2021-06-16 MED ORDER — GOJJI WEIGHT SCALE MISC: 1.0000 | 0 refills | Status: DC

## 2021-06-16 NOTE — Progress Notes (Signed)
New OB Intake  I connected with  Melissa Foster on 06/16/21 at  2:15 PM EDT by in person and verified that I am speaking with the correct person using two identifiers. Nurse is located at [facility] and pt is located at [facility].  I discussed the limitations, risks, security and privacy concerns of performing an evaluation and management service by telephone and the availability of in person appointments. I also discussed with the patient that there may be a patient responsible charge related to this service. The patient expressed understanding and agreed to proceed.  I explained I am completing New OB Intake today. We discussed her EDD is not determined at this time due to early gestational age. Pt is G3/P1011. I reviewed her allergies, medications, Medical/Surgical/OB history, and appropriate screenings. I informed her of [facility] services. Based on history, this is a/an  pregnancy uncomplicated .   There are no problems to display for this patient.   Concerns addressed today  Delivery Plans:  Plans to deliver at [facility].   MyChart/Babyscripts MyChart access verified. I explained pt will have some visits in office and some virtually. Babyscripts instructions given and order placed. Patient verifies receipt of registration text/e-mail. Account successfully created and app downloaded.  Blood Pressure Cuff  Blood pressure cuff ordered for patient to pick-up from [employer]. Explained after first prenatal appt pt will check weekly and document in Babyscripts.  Weight scale: Patient does not have weight scale. Weight scale ordered for patient to pick up form [facility].   Anatomy US Explained first scheduled [place] will be around 19 weeks. Dating and viability scan performed today.  Labs Discussed Avelina Laine genetic screening with patient. Would like both Panorama and Horizon drawn at new OB visit. Routine prenatal labs needed.  Covid Vaccine Patient has covid vaccine.   Mother/  Baby Dyad Candidate?    If yes, offer as possibility  Informed patient of [facility] website  and placed link in her AVS.   Social Determinants of Health Food Insecurity: Patient denies food insecurity. WIC Referral: Patient is interested in referral to [facility]  Transportation: Patient denies transportation needs. Childcare: Discussed no children allowed at ultrasound appointments. Offered childcare services; patient declines childcare services at this time.  Send link to Pregnancy Navigators   Placed OB Box on problem list and updated  First visit review I reviewed new OB appt with pt. I explained she will have a pelvic exam, ob bloodwork with genetic screening, and PAP smear. Explained pt will be seen by Peggy Constant at first visit; encounter routed to appropriate provider. Explained that patient will be seen by pregnancy navigator following visit with provider. [facility] information placed in AVS.   Hamilton Capri, RN 06/16/2021  2:11 PM

## 2021-06-29 ENCOUNTER — Inpatient Hospital Stay (HOSPITAL_COMMUNITY)
Admission: AD | Admit: 2021-06-29 | Discharge: 2021-06-29 | Disposition: A | Discharge status: Home / Self Care | Payer: BC Managed Care – PPO | Attending: Obstetrics and Gynecology | Admitting: Obstetrics and Gynecology

## 2021-06-29 ENCOUNTER — Encounter: Payer: BC Managed Care – PPO | Admitting: Obstetrics and Gynecology

## 2021-06-29 ENCOUNTER — Inpatient Hospital Stay (HOSPITAL_COMMUNITY): Payer: BC Managed Care – PPO

## 2021-06-29 ENCOUNTER — Encounter (HOSPITAL_COMMUNITY): Payer: Self-pay | Admitting: Obstetrics and Gynecology

## 2021-06-29 DIAGNOSIS — O4691 Antepartum hemorrhage, unspecified, first trimester: Secondary | ICD-10-CM | POA: Diagnosis not present

## 2021-06-29 DIAGNOSIS — Z3A01 Less than 8 weeks gestation of pregnancy: Secondary | ICD-10-CM | POA: Diagnosis not present

## 2021-06-29 DIAGNOSIS — Z3481 Encounter for supervision of other normal pregnancy, first trimester: Secondary | ICD-10-CM

## 2021-06-29 DIAGNOSIS — O209 Hemorrhage in early pregnancy, unspecified: Secondary | ICD-10-CM | POA: Diagnosis present

## 2021-06-29 LAB — URINALYSIS, ROUTINE W REFLEX MICROSCOPIC
Bilirubin Urine: NEGATIVE
Glucose, UA: NEGATIVE mg/dL
Ketones, ur: NEGATIVE mg/dL
Nitrite: NEGATIVE
Protein, ur: NEGATIVE mg/dL
Specific Gravity, Urine: 1.015 (ref 1.005–1.030)
pH: 7 (ref 5.0–8.0)

## 2021-06-29 NOTE — MAU Provider Note (Addendum)
History     CSN: [id number]  Arrival date and time: 06/29/21 0751   Event Date/Time   First Provider Initiated Contact with Patient 06/29/21 [phone]      Chief Complaint  Patient presents with   Vaginal Bleeding   Melissa Foster is a 25 y.o. G3P1011 at 50w0dwho presents today with spotting. She reports that this morning after urinating she had some pink spotting. She denies any pain.   Vaginal Bleeding The patient's primary symptoms include vaginal bleeding. The patient's pertinent negatives include no pelvic pain. This is a new problem. The current episode started today. The problem has been unchanged. The patient is experiencing no pain. She is pregnant. Vaginal discharge characteristics: pink. The vaginal bleeding is spotting. She has not been passing clots. She has not been passing tissue. Nothing aggravates the symptoms. She has tried nothing for the symptoms. Sexual activity: denies intercourse in the last 24 hours. Menstrual history: LMP 8/21, [place].   OB History     Gravida  3   Para  1   Term  1   Preterm      AB  1   Living  1      SAB  1   IAB      Ectopic      Multiple  0   Live Births  1           Past Medical History:  Diagnosis Date   Medical history non-contributory     Past Surgical History:  Procedure Laterality Date   NO PAST SURGERIES      Family History  Problem Relation Age of Onset   Healthy Mother    Hypertension Father     Social History   Tobacco Use   Smoking status: Never   Smokeless tobacco: Never  Vaping Use   Vaping Use: Never used  Substance Use Topics   Alcohol use: Not Currently    Comment: occasionally prior to pregnancy   Drug use: No    Allergies: No Known Allergies  Medications Prior to Admission  Medication Sig Dispense Refill Last Dose   Blood Pressure Monitoring (BLOOD PRESSURE KIT) DEVI 1 kit by Does not apply route once a week. 1 each 0    Misc. Devices (GOJJI WEIGHT SCALE) MISC 1  Device by Does not apply route every 30 (thirty) days. 1 each 0    Prenat-Fe Poly-Methfol-FA-DHA (VITAFOL ULTRA) 29-0.6-0.4-200 MG CAPS Take 1 tablet by mouth daily. 30 capsule 12     Review of Systems  Genitourinary:  Positive for vaginal bleeding. Negative for pelvic pain.  All other systems reviewed and are negative. Physical Exam   Blood pressure (!) 103/58, pulse 66, temperature 98.2 F (36.8 C), temperature source Oral, resp. rate 18, height _0  (1.626 m), weight 86.6 kg, last menstrual period 04/11/2021, SpO2 100 %.  Physical Exam Vitals and nursing note reviewed. Exam conducted with a chaperone present.  Constitutional:      General: She is not in acute distress. HENT:     Head: Normocephalic.  Eyes:     Pupils: Pupils are equal, round, and reactive to light.  Cardiovascular:     Rate and Rhythm: Normal rate.  Pulmonary:     Effort: Pulmonary effort is normal.  Abdominal:     Palpations: Abdomen is soft.     Tenderness: There is no abdominal tenderness.  Skin:    General: Skin is warm and dry.  Neurological:  Mental Status: She is alert and oriented to person, place, and time.  Psychiatric:        Mood and Affect: Mood normal.        Behavior: Behavior normal.   Results for orders placed or performed during the hospital encounter of 06/29/21 (from the past 24 hour(s))  Urinalysis, Routine w reflex microscopic Urine, Clean Catch     Status: Abnormal   Collection Time: 06/29/21  9:19 AM  Result Value Ref Range   Color, Urine YELLOW YELLOW   APPearance CLOUDY (A) CLEAR   Specific Gravity, Urine 1.015 1.005 - 1.030   pH 7.0 5.0 - 8.0   Glucose, UA NEGATIVE NEGATIVE mg/dL   Hgb urine dipstick MODERATE (A) NEGATIVE   Bilirubin Urine NEGATIVE NEGATIVE   Ketones, ur NEGATIVE NEGATIVE mg/dL   Protein, ur NEGATIVE NEGATIVE mg/dL   Nitrite NEGATIVE NEGATIVE   Leukocytes,Ua LARGE (A) NEGATIVE   RBC / HPF 0-5 0 - 5 RBC/hpf   WBC, UA 6-10 0 - 5 WBC/hpf   Bacteria,  UA RARE (A) NONE SEEN   Squamous Epithelial / LPF 6-10 0 - 5   Mucus PRESENT    Amorphous Crystal PRESENT    US OB LESS THAN 14 WEEKS WITH OB TRANSVAGINAL  Result Date: 06/29/2021 CLINICAL DATA:  25 year old pregnant female presents with vaginal bleeding. EDC by LMP: 01/16/2022, projecting to an expected gestational age of [redacted] weeks 2 days. Quantitative beta HCG not available. EXAM: OBSTETRIC <14 WK [place] AND TRANSVAGINAL OB US TECHNIQUE: Both transabdominal and transvaginal ultrasound examinations were performed for complete evaluation of the gestation as well as the maternal uterus, adnexal regions, and pelvic cul-de-sac. Transvaginal technique was performed to assess early pregnancy. COMPARISON:  06/16/2021 outside obstetric scan. FINDINGS: Intrauterine gestational sac: Single Yolk sac:  Visualized. Embryo:  Visualized. Cardiac Activity: Visualized. Heart Rate: 104 bpm CRL:  6.3 mm   6 w   3 d                  [place] EDC: 02/19/2022 Subchorionic hemorrhage:  None visualized. Maternal uterus/adnexae: Anteverted uterus. No uterine fibroids demonstrated. No abnormal free fluid in the pelvis. Right ovary measures 3.3 x 2.2 x 2.3 cm and contains a corpus luteum. Left ovary measures 2.2 x 1.8 x 1.9 cm. No abnormal ovarian or adnexal masses. IMPRESSION: 1. Single living intrauterine gestation at 6 weeks 3 days by crown-rump length, discordant with the provided menstrual dating. Low normal embryonic heart rate (104 bpm). Suggest short-term follow-up obstetric scan in 4 weeks to assess fetal growth. 2. No ovarian or adnexal abnormality.  No abnormal free fluid. Electronically Signed   By: Ilona Sorrel M.D.   On: 06/29/2021 09:27     MAU Course  Procedures  MDM DW patient that there has been growth since last [place] and now FHR is present. Will get FU in 4 weeks to confirm continued growth.   Assessment and Plan   1. Encounter for supervision of other normal pregnancy in first trimester   2. Vaginal bleeding in  pregnancy, first trimester   3. [redacted] weeks gestation of pregnancy    DC home Comfort measures reviewed  1st Trimester precautions  Bleeding precautions RX: no new RX, outpatient [place] ordered for 4 weeks for now Return to MAU as needed FU with OB as planned   Follow-up Information     [facility]   Specialty: Radiology Why: They will call you with an appointment Contact  information: [address] [place] [address] [facility] Follow up.   Why: As scheduled Contact information: [place] [phone] [place] DNP, CNM  06/29/21  10:19 AM

## 2021-06-29 NOTE — MAU Note (Signed)
Noted pink this morning when she wiped after urination. No pain, no pain with urination. Denies recent intercourse.

## 2021-07-06 ENCOUNTER — Encounter (HOSPITAL_COMMUNITY): Payer: Self-pay

## 2021-07-06 ENCOUNTER — Ambulatory Visit (HOSPITAL_COMMUNITY)
Admission: RE | Admit: 2021-07-06 | Discharge: 2021-07-06 | Disposition: A | Discharge status: Home / Self Care | Payer: BC Managed Care – PPO | Source: Ambulatory Visit | Attending: Advanced Practice Midwife | Admitting: Advanced Practice Midwife

## 2021-07-06 ENCOUNTER — Telehealth: Payer: Self-pay

## 2021-07-06 ENCOUNTER — Other Ambulatory Visit: Payer: Self-pay

## 2021-07-06 DIAGNOSIS — O3680X0 Pregnancy with inconclusive fetal viability, not applicable or unspecified: Secondary | ICD-10-CM

## 2021-07-06 DIAGNOSIS — Z3A01 Less than 8 weeks gestation of pregnancy: Secondary | ICD-10-CM

## 2021-07-06 DIAGNOSIS — Z3481 Encounter for supervision of other normal pregnancy, first trimester: Secondary | ICD-10-CM

## 2021-07-06 DIAGNOSIS — O209 Hemorrhage in early pregnancy, unspecified: Secondary | ICD-10-CM

## 2021-07-06 NOTE — Telephone Encounter (Signed)
Revonda Standard with Outpatient U/S called to see if patient needed to have repeat U/S today. Pt had original dating scan in our office on 06/16/21 and repeat U/S for vaginal bleeding on 06/29/21 that stated to repeat U/S in 4 weeks. Per Dr. Jolayne Panther, repeat U/S in 4 weeks from 06/29/21 per recommendation. Will place order in Epic. Revonda Standard informed patient can leave and U/S was not needed.

## 2021-07-26 ENCOUNTER — Other Ambulatory Visit: Payer: Self-pay

## 2021-07-26 ENCOUNTER — Ambulatory Visit (INDEPENDENT_AMBULATORY_CARE_PROVIDER_SITE_OTHER): Payer: BC Managed Care – PPO | Admitting: Obstetrics and Gynecology

## 2021-07-26 ENCOUNTER — Other Ambulatory Visit (HOSPITAL_COMMUNITY)
Admission: RE | Admit: 2021-07-26 | Discharge: 2021-07-26 | Disposition: A | Discharge status: Home / Self Care | Payer: BC Managed Care – PPO | Source: Ambulatory Visit | Attending: Obstetrics and Gynecology | Admitting: Obstetrics and Gynecology

## 2021-07-26 ENCOUNTER — Encounter: Payer: Self-pay | Admitting: Obstetrics and Gynecology

## 2021-07-26 DIAGNOSIS — Z3481 Encounter for supervision of other normal pregnancy, first trimester: Secondary | ICD-10-CM

## 2021-07-26 DIAGNOSIS — O99211 Obesity complicating pregnancy, first trimester: Secondary | ICD-10-CM | POA: Diagnosis not present

## 2021-07-26 DIAGNOSIS — Z3A10 10 weeks gestation of pregnancy: Secondary | ICD-10-CM | POA: Diagnosis not present

## 2021-07-26 DIAGNOSIS — O99210 Obesity complicating pregnancy, unspecified trimester: Secondary | ICD-10-CM | POA: Insufficient documentation

## 2021-07-26 MED ORDER — ASPIRIN EC 81 MG PO TBEC: 81.0000 mg | DELAYED_RELEASE_TABLET | Freq: Every day | ORAL | 2 refills | Status: DC

## 2021-07-26 MED ORDER — DOXYLAMINE-PYRIDOXINE 10-10 MG PO TBEC: 2.0000 | DELAYED_RELEASE_TABLET | Freq: Every day | ORAL | 5 refills | Status: DC

## 2021-07-26 MED ORDER — PROMETHAZINE HCL 25 MG PO TABS: 25.0000 mg | ORAL_TABLET | Freq: Four times a day (QID) | ORAL | 2 refills | Status: DC | PRN

## 2021-07-26 NOTE — Progress Notes (Signed)
  Subjective:    Melissa Foster is a G3P1011 [redacted]w[redacted]d being seen today for her first obstetrical visit.  Her obstetrical history is significant for maternal obesity and previously uncomplicated pregnancy. Patient does intend to breast feed. Pregnancy history fully reviewed.  Patient reports nausea.  Vitals:   07/26/21 1503  BP: 108/69  Pulse: 73  Weight: 194 lb 4.8 oz (88.1 kg)    HISTORY: OB History  Gravida Para Term Preterm AB Living  3 1 1   1 1   SAB IAB Ectopic Multiple Live Births  1     0 1    # Outcome Date GA Lbr Len/2nd Weight Sex Delivery Anes PTL Lv  3 Current           2 Term 07/03/18 [redacted]w[redacted]d 14:20 / 00:58 6 lb 11.9 oz (3.059 kg) M Vag-Spont Local  LIV     Birth Comments: WNL  1 SAB 03/2016           Past Medical History:  Diagnosis Date  . Medical history non-contributory    Past Surgical History:  Procedure Laterality Date  . NO PAST SURGERIES     Family History  Problem Relation Age of Onset  . Healthy Mother   . Hypertension Father      Exam    Uterus:   12-weeks  Pelvic Exam:    Perineum: No Hemorrhoids, Normal Perineum   Vulva: normal   Vagina:  normal mucosa, normal discharge   pH:    Cervix: multiparous appearance and cervix is closed and long   Adnexa: normal adnexa and no mass, fullness, tenderness   Bony Pelvis: gynecoid  System: Breast:  normal appearance, no masses or tenderness   Skin: normal coloration and turgor, no rashes    Neurologic: oriented, no focal deficits   Extremities: normal strength, tone, and muscle mass   HEENT extra ocular movement intact   Mouth/Teeth mucous membranes moist, pharynx normal without lesions and dental hygiene good   Neck supple and no masses   Cardiovascular: regular rate and rhythm   Respiratory:  appears well, vitals normal, no respiratory distress, acyanotic, normal RR, chest clear, no wheezing, crepitations, rhonchi, normal symmetric air entry   Abdomen: soft, non-tender; bowel sounds  normal; no masses,  no organomegaly   Urinary:       Assessment:    Pregnancy: G3P1011 Patient Active Problem List   Diagnosis Date Noted  . Maternal obesity affecting pregnancy, antepartum 07/26/2021  . Encounter for supervision of normal pregnancy in first trimester 06/16/2021        Plan:     Initial labs drawn. Prenatal vitamins. Problem list reviewed and updated. Genetic Screening discussed : panorama ordered.  Ultrasound discussed; fetal survey: ordered. A1C today Rx ASA provided to start at 12 weeks  Follow up in 4 weeks. 50% of 30 min visit spent on counseling and coordination of care.     Syncere Kaminski 07/26/2021

## 2021-07-26 NOTE — Patient Instructions (Signed)
First Trimester of Pregnancy °The first trimester of pregnancy starts on the first day of your last menstrual period until the end of week 12. This is months 1 through 3 of pregnancy. A week after a sperm fertilizes an egg, the egg will implant into the wall of the uterus and begin to develop into a baby. By the end of 12 weeks, all the baby's organs will be formed and the baby will be 2-3 inches in size. °Body changes during your first trimester °Your body goes through many changes during pregnancy. The changes vary and generally return to normal after your baby is born. °Physical changes °You may gain or lose weight. °Your breasts may begin to grow larger and become tender. The tissue that surrounds your nipples (areola) may become darker. °Dark spots or blotches (chloasma or mask of pregnancy) may develop on your face. °You may have changes in your hair. These can include thickening or thinning of your hair or changes in texture. °Health changes °You may feel nauseous, and you may vomit. °You may have heartburn. °You may develop headaches. °You may develop constipation. °Your gums may bleed and may be sensitive to brushing and flossing. °Other changes °You may tire easily. °You may urinate more often. °Your menstrual periods will stop. °You may have a loss of appetite. °You may develop cravings for certain kinds of food. °You may have changes in your emotions from day to day. °You may have more vivid and strange dreams. °Follow these instructions at home: °Medicines °Follow your health care provider's instructions regarding medicine use. Specific medicines may be either safe or unsafe to take during pregnancy. Do not take any medicines unless told to by your health care provider. °Take a prenatal vitamin that contains at least 600 micrograms (mcg) of folic acid. °Eating and drinking °Eat a healthy diet that includes fresh fruits and vegetables, whole grains, good sources of protein such as meat, eggs, or tofu,  and low-fat dairy products. °Avoid raw meat and unpasteurized juice, milk, and cheese. These carry germs that can harm you and your baby. °If you feel nauseous or you vomit: °Eat 4 or 5 small meals a day instead of 3 large meals. °Try eating a few soda crackers. °Drink liquids between meals instead of during meals. °You may need to take these actions to prevent or treat constipation: °Drink enough fluid to keep your urine pale yellow. °Eat foods that are high in fiber, such as beans, whole grains, and fresh fruits and vegetables. °Limit foods that are high in fat and processed sugars, such as fried or sweet foods. °Activity °Exercise only as directed by your health care provider. Most people can continue their usual exercise routine during pregnancy. Try to exercise for 30 minutes at least 5 days a week. °Stop exercising if you develop pain or cramping in the lower abdomen or lower back. °Avoid exercising if it is very hot or humid or if you are at high altitude. °Avoid heavy lifting. °If you choose to, you may have sex unless your health care provider tells you not to. °Relieving pain and discomfort °Wear a good support bra to relieve breast tenderness. °Rest with your legs elevated if you have leg cramps or low back pain. °If you develop bulging veins (varicose veins) in your legs: °Wear support hose as told by your health care provider. °Elevate your feet for 15 minutes, 3-4 times a day. °Limit salt in your diet. °Safety °Wear your seat belt at all times when driving   or riding in a car. °Talk with your health care provider if someone is verbally or physically abusive to you. °Talk with your health care provider if you are feeling sad or have thoughts of hurting yourself. °Lifestyle °Do not use hot tubs, steam rooms, or saunas. °Do not douche. Do not use tampons or scented sanitary pads. °Do not use herbal remedies, alcohol, illegal drugs, or medicines that are not approved by your health care provider. Chemicals  in these products can harm your baby. °Do not use any products that contain nicotine or tobacco, such as cigarettes, e-cigarettes, and chewing tobacco. If you need help quitting, ask your health care provider. °Avoid cat litter boxes and soil used by cats. These carry germs that can cause birth defects in the baby and possibly loss of the unborn baby (fetus) by miscarriage or stillbirth. °General instructions °During routine prenatal visits in the first trimester, your health care provider will do a physical exam, perform necessary tests, and ask you how things are going. Keep all follow-up visits. This is important. °Ask for help if you have counseling or nutritional needs during pregnancy. Your health care provider can offer advice or refer you to specialists for help with various needs. °Schedule a dentist appointment. At home, brush your teeth with a soft toothbrush. Floss gently. °Write down your questions. Take them to your prenatal visits. °Where to find more information °American Pregnancy Association: americanpregnancy.org °American College of Obstetricians and Gynecologists: acog.org/en/Womens%20Health/Pregnancy °Office on Women's Health: womenshealth.gov/pregnancy °Contact a health care provider if you have: °Dizziness. °A fever. °Mild pelvic cramps, pelvic pressure, or nagging pain in the abdominal area. °Nausea, vomiting, or diarrhea that lasts for 24 hours or longer. °A bad-smelling vaginal discharge. °Pain when you urinate. °Known exposure to a contagious illness, such as chickenpox, measles, Zika virus, HIV, or hepatitis. °Get help right away if you have: °Spotting or bleeding from your vagina. °Severe abdominal cramping or pain. °Shortness of breath or chest pain. °Any kind of trauma, such as from a fall or a car crash. °New or increased pain, swelling, or redness in an arm or leg. °Summary °The first trimester of pregnancy starts on the first day of your last menstrual period until the end of week  12 (months 1 through 3). °Eating 4 or 5 small meals a day rather than 3 large meals may help to relieve nausea and vomiting. °Do not use any products that contain nicotine or tobacco, such as cigarettes, e-cigarettes, and chewing tobacco. If you need help quitting, ask your health care provider. °Keep all follow-up visits. This is important. °This information is not intended to replace advice given to you by your health care provider. Make sure you discuss any questions you have with your health care provider. °Document Revised: 01/15/2020 Document Reviewed: 11/21/2019 °Elsevier Patient Education © 2022 [employer] ° °Second Trimester of Pregnancy °The second trimester of pregnancy is from week 13 through week 27. This is months 4 through 6 of pregnancy. The second trimester is often a time when you feel your best. Your body has adjusted to being pregnant, and you begin to feel better physically. °During the second trimester: °Morning sickness has lessened or stopped completely. °You may have more energy. °You may have an increase in appetite. °The second trimester is also a time when the unborn baby (fetus) is growing rapidly. At the end of the sixth month, the fetus may be up to 12 inches long and weigh about 1½ pounds. You will likely begin to   feel the baby move (quickening) between 16 and 20 weeks of pregnancy. °Body changes during your second trimester °Your body continues to go through many changes during your second trimester. The changes vary and generally return to normal after the baby is born. °Physical changes °Your weight will continue to increase. You will notice your lower abdomen bulging out. °You may begin to get stretch marks on your hips, abdomen, and breasts. °Your breasts will continue to grow and to become tender. °Dark spots or blotches (chloasma or mask of pregnancy) may develop on your face. °A dark line from your belly button to the pubic area (linea nigra) may appear. °You may have  changes in your hair. These can include thickening of your hair, rapid growth, and changes in texture. Some people also have hair loss during or after pregnancy, or hair that feels dry or thin. °Health changes °You may develop headaches. °You may have heartburn. °You may develop constipation. °You may develop hemorrhoids or swollen, bulging veins (varicose veins). °Your gums may bleed and may be sensitive to brushing and flossing. °You may urinate more often because the fetus is pressing on your bladder. °You may have back pain. This is caused by: °Weight gain. °Pregnancy hormones that are relaxing the joints in your pelvis. °A shift in weight and the muscles that support your balance. °Follow these instructions at home: °Medicines °Follow your health care provider's instructions regarding medicine use. Specific medicines may be either safe or unsafe to take during pregnancy. Do not take any medicines unless approved by your health care provider. °Take a prenatal vitamin that contains at least 600 micrograms (mcg) of folic acid. °Eating and drinking °Eat a healthy diet that includes fresh fruits and vegetables, whole grains, good sources of protein such as meat, eggs, or tofu, and low-fat dairy products. °Avoid raw meat and unpasteurized juice, milk, and cheese. These carry germs that can harm you and your baby. °You may need to take these actions to prevent or treat constipation: °Drink enough fluid to keep your urine pale yellow. °Eat foods that are high in fiber, such as beans, whole grains, and fresh fruits and vegetables. °Limit foods that are high in fat and processed sugars, such as fried or sweet foods. °Activity °Exercise only as directed by your health care provider. Most people can continue their usual exercise routine during pregnancy. Try to exercise for 30 minutes at least 5 days a week. Stop exercising if you develop contractions in your uterus. °Stop exercising if you develop pain or cramping in the  lower abdomen or lower back. °Avoid exercising if it is very hot or humid or if you are at a high altitude. °Avoid heavy lifting. °If you choose to, you may have sex unless your health care provider tells you not to. °Relieving pain and discomfort °Wear a supportive bra to prevent discomfort from breast tenderness. °Take warm sitz baths to soothe any pain or discomfort caused by hemorrhoids. Use hemorrhoid cream if your health care provider approves. °Rest with your legs raised (elevated) if you have leg cramps or low back pain. °If you develop varicose veins: °Wear support hose as told by your health care provider. °Elevate your feet for 15 minutes, 3-4 times a day. °Limit salt in your diet. °Safety °Wear your seat belt at all times when driving or riding in a car. °Talk with your health care provider if someone is verbally or physically abusive to you. °Lifestyle °Do not use hot tubs, steam rooms, or saunas. °  Do not douche. Do not use tampons or scented sanitary pads. °Avoid cat litter boxes and soil used by cats. These carry germs that can cause birth defects in the baby and possibly loss of the fetus by miscarriage or stillbirth. °Do not use herbal remedies, alcohol, illegal drugs, or medicines that are not approved by your health care provider. Chemicals in these products can harm your baby. °Do not use any products that contain nicotine or tobacco, such as cigarettes, e-cigarettes, and chewing tobacco. If you need help quitting, ask your health care provider. °General instructions °During a routine prenatal visit, your health care provider will do a physical exam and other tests. He or she will also discuss your overall health. Keep all follow-up visits. This is important. °Ask your health care provider for a referral to a local prenatal education class. °Ask for help if you have counseling or nutritional needs during pregnancy. Your health care provider can offer advice or refer you to specialists for help  with various needs. °Where to find more information °American Pregnancy Association: americanpregnancy.org °American College of Obstetricians and Gynecologists: acog.org/en/Womens%20Health/Pregnancy °Office on Women's Health: womenshealth.gov/pregnancy °Contact a health care provider if you have: °A headache that does not go away when you take medicine. °Vision changes or you see spots in front of your eyes. °Mild pelvic cramps, pelvic pressure, or nagging pain in the abdominal area. °Persistent nausea, vomiting, or diarrhea. °A bad-smelling vaginal discharge or foul-smelling urine. °Pain when you urinate. °Sudden or extreme swelling of your face, hands, ankles, feet, or legs. °A fever. °Get help right away if you: °Have fluid leaking from your vagina. °Have spotting or bleeding from your vagina. °Have severe abdominal cramping or pain. °Have difficulty breathing. °Have chest pain. °Have fainting spells. °Have not felt your baby move for the time period told by your health care provider. °Have new or increased pain, swelling, or redness in an arm or leg. °Summary °The second trimester of pregnancy is from week 13 through week 27 (months 4 through 6). °Do not use herbal remedies, alcohol, illegal drugs, or medicines that are not approved by your health care provider. Chemicals in these products can harm your baby. °Exercise only as directed by your health care provider. Most people can continue their usual exercise routine during pregnancy. °Keep all follow-up visits. This is important. °This information is not intended to replace advice given to you by your health care provider. Make sure you discuss any questions you have with your health care provider. °Document Revised: 01/15/2020 Document Reviewed: 11/21/2019 °Elsevier Patient Education © 2022 Elsevier Inc. ° °

## 2021-07-26 NOTE — Progress Notes (Signed)
108/69 

## 2021-07-27 LAB — OBSTETRIC PANEL, INCLUDING HIV
Antibody Screen: NEGATIVE
Basophils Absolute: 0 10*3/uL (ref 0.0–0.2)
Basos: 0 %
EOS (ABSOLUTE): 0.1 10*3/uL (ref 0.0–0.4)
Eos: 1 %
HIV Screen 4th Generation wRfx: NONREACTIVE
Hematocrit: 39.3 % (ref 34.0–46.6)
Hemoglobin: 13.5 g/dL (ref 11.1–15.9)
Hepatitis B Surface Ag: NEGATIVE
Immature Grans (Abs): 0 10*3/uL (ref 0.0–0.1)
Immature Granulocytes: 0 %
Lymphocytes Absolute: 2.2 10*3/uL (ref 0.7–3.1)
Lymphs: 24 %
MCH: 30.1 pg (ref 26.6–33.0)
MCHC: 34.4 g/dL (ref 31.5–35.7)
MCV: 88 fL (ref 79–97)
Monocytes Absolute: 0.5 10*3/uL (ref 0.1–0.9)
Monocytes: 5 %
Neutrophils Absolute: 6.3 10*3/uL (ref 1.4–7.0)
Neutrophils: 70 %
Platelets: 241 10*3/uL (ref 150–450)
RBC: 4.49 x10E6/uL (ref 3.77–5.28)
RDW: 12.5 % (ref 11.7–15.4)
RPR Ser Ql: NONREACTIVE
Rh Factor: POSITIVE
Rubella Antibodies, IGG: 2.29 index (ref >0.99)
WBC: 9.1 10*3/uL (ref 3.4–10.8)

## 2021-07-27 LAB — HEPATITIS C ANTIBODY: Hep C Virus Ab: <0.1 s/co ratio (ref 0.0–0.9)

## 2021-07-27 LAB — HEMOGLOBIN A1C
Est. average glucose Bld gHb Est-mCnc: 97 mg/dL
Hgb A1c MFr Bld: 5 % (ref 4.8–5.6)

## 2021-07-28 ENCOUNTER — Other Ambulatory Visit: Payer: BC Managed Care – PPO

## 2021-07-28 LAB — URINE CULTURE, OB REFLEX

## 2021-07-28 LAB — CULTURE, OB URINE

## 2021-07-29 LAB — CYTOLOGY - PAP: Diagnosis: NEGATIVE

## 2021-07-30 ENCOUNTER — Telehealth: Payer: Self-pay | Admitting: *Deleted

## 2021-07-30 NOTE — Telephone Encounter (Signed)
TC to notify of Diclegis RX denied and give info on Unisom and Vit B6. No answer. Left HIPPA compliant VM.

## 2021-08-02 ENCOUNTER — Encounter: Payer: Self-pay | Admitting: Obstetrics and Gynecology

## 2021-08-05 ENCOUNTER — Encounter: Payer: Self-pay | Admitting: Obstetrics and Gynecology

## 2021-08-18 ENCOUNTER — Inpatient Hospital Stay (HOSPITAL_COMMUNITY)
Admission: AD | Admit: 2021-08-18 | Discharge: 2021-08-19 | Disposition: A | Discharge status: Home / Self Care | Payer: BC Managed Care – PPO | Attending: Obstetrics and Gynecology | Admitting: Obstetrics and Gynecology

## 2021-08-18 ENCOUNTER — Other Ambulatory Visit: Payer: Self-pay

## 2021-08-18 DIAGNOSIS — O4692 Antepartum hemorrhage, unspecified, second trimester: Secondary | ICD-10-CM

## 2021-08-18 DIAGNOSIS — O26852 Spotting complicating pregnancy, second trimester: Secondary | ICD-10-CM | POA: Insufficient documentation

## 2021-08-18 DIAGNOSIS — Z6790 Unspecified blood type, Rh positive: Secondary | ICD-10-CM

## 2021-08-18 DIAGNOSIS — Z3A14 14 weeks gestation of pregnancy: Secondary | ICD-10-CM | POA: Diagnosis not present

## 2021-08-18 LAB — URINALYSIS, ROUTINE W REFLEX MICROSCOPIC
Bilirubin Urine: NEGATIVE
Glucose, UA: NEGATIVE mg/dL
Ketones, ur: NEGATIVE mg/dL
Nitrite: NEGATIVE
Protein, ur: NEGATIVE mg/dL
Specific Gravity, Urine: 1.005 (ref 1.005–1.030)
pH: 7 (ref 5.0–8.0)

## 2021-08-18 LAB — WET PREP, GENITAL
Clue Cells Wet Prep HPF POC: NONE SEEN
Sperm: NONE SEEN
Trich, Wet Prep: NONE SEEN
WBC, Wet Prep HPF POC: NONE SEEN — AB (ref <10)
Yeast Wet Prep HPF POC: NONE SEEN

## 2021-08-18 NOTE — MAU Note (Addendum)
This afternoon went to [facility] and was having vag bleeding. Tonight I went to the [facility] and had brown vag d/c but no blood. No pain. No recent intercourse

## 2021-08-18 NOTE — MAU Note (Addendum)
Lisa Leftwich Kirby CNM in [facility] to see pt and discuss d/c plan. Pt then d/c home by provider from [facility].

## 2021-08-18 NOTE — MAU Provider Note (Signed)
Chief Complaint: Vaginal Bleeding   Event Date/Time   First Provider Initiated Contact with Patient 08/18/21 2339      SUBJECTIVE HPI: Melissa Foster is a 25 y.o. G3P1011 at 42w1dby early ultrasound who presents to maternity admissions reporting vaginal bleeding with onset today that was dark red, noted when urinating and wiping with scant brown spotting after this episode.  There are no other symptoms. She has not had intercourse in the last 48 hours.     HPI  Past Medical History:  Diagnosis Date   Medical history non-contributory    Past Surgical History:  Procedure Laterality Date   NO PAST SURGERIES     Social History   Socioeconomic History   Marital status: Single    Spouse name: Not on file   Number of children: Not on file   Years of education: Not on file   Highest education level: Not on file  Occupational History   Not on file  Tobacco Use   Smoking status: Never   Smokeless tobacco: Never  Vaping Use   Vaping Use: Never used  Substance and Sexual Activity   Alcohol use: Not Currently    Comment: occasionally prior to pregnancy   Drug use: No   Sexual activity: Yes    Partners: Male    Birth control/protection: None  Other Topics Concern   Not on file  Social History Narrative   Not on file   Social Determinants of Health   Financial Resource Strain: Not on file  Food Insecurity: Not on file  Transportation Needs: Not on file  Physical Activity: Not on file  Stress: Not on file  Social Connections: Not on file  Intimate Partner Violence: Not on file   No current facility-administered medications on file prior to encounter.   Current Outpatient Medications on File Prior to Encounter  Medication Sig Dispense Refill   aspirin EC 81 MG tablet Take 1 tablet (81 mg total) by mouth daily. Take after 12 weeks for prevention of preeclampsia later in pregnancy 300 tablet 2   Blood Pressure Monitoring (BLOOD PRESSURE KIT) DEVI 1 kit by Does  not apply route once a week. (Patient not taking: Reported on 07/26/2021) 1 each 0   Doxylamine-Pyridoxine (DICLEGIS) 10-10 MG TBEC Take 2 tablets by mouth at bedtime. If symptoms persist, add one tablet in the morning and one in the afternoon 100 tablet 5   Misc. Devices (GOJJI WEIGHT SCALE) MISC 1 Device by Does not apply route every 30 (thirty) days. (Patient not taking: Reported on 07/26/2021) 1 each 0   Prenat-Fe Poly-Methfol-FA-DHA (VITAFOL ULTRA) 29-0.6-0.4-200 MG CAPS Take 1 tablet by mouth daily. 30 capsule 12   promethazine (PHENERGAN) 25 MG tablet Take 1 tablet (25 mg total) by mouth every 6 (six) hours as needed for nausea or vomiting. 30 tablet 2   No Known Allergies  ROS:  Review of Systems   I have reviewed patient's Past Medical Hx, Surgical Hx, Family Hx, Social Hx, medications and allergies.   Physical Exam  Patient Vitals for the past 24 hrs:  BP Temp Pulse Resp SpO2 Height Weight  08/18/21 2127 (!) 111/50 (!) 97.4 F (36.3 C) 67 17 100 % _0  (1.651 m) 88.5 kg   Constitutional: Well-developed, well-nourished female in no acute distress.  Cardiovascular: normal rate Respiratory: normal effort GI: Abd soft, non-tender. Pos BS x 4 MS: Extremities nontender, no edema, normal ROM Neurologic: Alert and oriented x 4.  GU: Neg CVAT.  PELVIC EXAM: Cervix pink, visually closed, without lesion, scant white creamy discharge, vaginal walls and external genitalia normal Bimanual exam: Cervix 0/long/high, firm, anterior, neg CMT, uterus nontender, nonenlarged, adnexa without tenderness, enlargement, or mass  FHT 142 by doppler  LAB RESULTS Results for orders placed or performed during the hospital encounter of 08/18/21 (from the past 24 hour(s))  Urinalysis, Routine w reflex microscopic Urine, Clean Catch     Status: Abnormal   Collection Time: 08/18/21  9:32 PM  Result Value Ref Range   Color, Urine STRAW (A) YELLOW   APPearance HAZY (A) CLEAR   Specific Gravity, Urine  1.005 1.005 - 1.030   pH 7.0 5.0 - 8.0   Glucose, UA NEGATIVE NEGATIVE mg/dL   Hgb urine dipstick MODERATE (A) NEGATIVE   Bilirubin Urine NEGATIVE NEGATIVE   Ketones, ur NEGATIVE NEGATIVE mg/dL   Protein, ur NEGATIVE NEGATIVE mg/dL   Nitrite NEGATIVE NEGATIVE   Leukocytes,Ua LARGE (A) NEGATIVE   RBC / HPF 0-5 0 - 5 RBC/hpf   WBC, UA 0-5 0 - 5 WBC/hpf   Bacteria, UA RARE (A) NONE SEEN   Squamous Epithelial / LPF 0-5 0 - 5  Wet prep, genital     Status: Abnormal   Collection Time: 08/18/21 11:09 PM   Specimen: Vaginal; Genital  Result Value Ref Range   Yeast Wet Prep HPF POC NONE SEEN NONE SEEN   Trich, Wet Prep NONE SEEN NONE SEEN   Clue Cells Wet Prep HPF POC NONE SEEN NONE SEEN   WBC, Wet Prep HPF POC NONE SEEN (A) <10   Sperm NONE SEEN     O/Positive/-- (12/05 1534)  IMAGING No results found.  MAU Management/MDM: Orders Placed This Encounter  Procedures   Wet prep, genital   Culture, OB Urine   Urinalysis, Routine w reflex microscopic Urine, Clean Catch   Discharge patient    No orders of the defined types were placed in this encounter.   Normal FHT by doppler in MAU.  Bleeding now minimal per pt.  Vaginal swab for yeast/BV/trich wnl.  GCC pending.  UA shows large leukocytes and hemoglobin but nitrite negative.  Urine sent for culture.  F/U at [facility] as scheduled, return to MAU with increased bleeding or other emergency symptoms.     ASSESSMENT 1. Vaginal bleeding in pregnancy, second trimester   2. Blood type, Rh positive   3. [redacted] weeks gestation of pregnancy     PLAN Discharge home Allergies as of 08/18/2021   No Known Allergies      Medication List     TAKE these medications    aspirin EC 81 MG tablet Take 1 tablet (81 mg total) by mouth daily. Take after 12 weeks for prevention of preeclampsia later in pregnancy   Blood Pressure Kit Devi 1 kit by Does not apply route once a week.   Doxylamine-Pyridoxine 10-10 MG Tbec Commonly known as:  Diclegis Take 2 tablets by mouth at bedtime. If symptoms persist, add one tablet in the morning and one in the afternoon   Gojji Weight Scale Misc 1 Device by Does not apply route every 30 (thirty) days.   promethazine 25 MG tablet Commonly known as: PHENERGAN Take 1 tablet (25 mg total) by mouth every 6 (six) hours as needed for nausea or vomiting.   Vitafol Ultra 29-0.6-0.4-200 MG Caps Take 1 tablet by mouth daily.        Follow-up Information     [facility] Follow up.  Why: As scheduled Contact information: [place] [id number] [facility].   Specialty: Obstetrics and Gynecology Why: As needed for emergencies Contact information: [address] [phone] Booker Blackey [place] Certified Nurse-Midwife 08/18/2021  11:53 PM

## 2021-08-18 NOTE — MAU Note (Signed)
Pt instructed in obtaining vag swabs which she did without difficulty. 

## 2021-08-19 LAB — CULTURE, OB URINE

## 2021-08-19 LAB — GC/CHLAMYDIA PROBE AMP (~~LOC~~) NOT AT ARMC
Chlamydia: NEGATIVE
Comment: NEGATIVE
Comment: NORMAL
Neisseria Gonorrhea: NEGATIVE

## 2021-08-22 NOTE — L&D Delivery Note (Signed)
Delivery Note At 1315 a viable female infant was delivered via SVD, presentation: OA. APGAR: 9, 9; weight pending.   Placenta status: spontaneously delivered intact with gentle cord traction. Fundus firm with massage and Pitocin.   Anesthesia: local Lacerations: 2nd degree and periclitoral Suture used for repair: 2-0, 3-0 Vicryl rapide Est. Blood Loss (mL): 100 Placenta to LD Complications none Cord ph n/a   Mom to postpartum. Baby to Couplet care / Skin to Skin.    Donette Larry, CNM 02/05/2022 1:52 PM

## 2021-08-24 ENCOUNTER — Encounter (HOSPITAL_COMMUNITY): Payer: Self-pay | Admitting: Obstetrics and Gynecology

## 2021-08-24 ENCOUNTER — Inpatient Hospital Stay (HOSPITAL_BASED_OUTPATIENT_CLINIC_OR_DEPARTMENT_OTHER): Payer: BC Managed Care – PPO

## 2021-08-24 ENCOUNTER — Inpatient Hospital Stay (HOSPITAL_COMMUNITY)
Admission: AD | Admit: 2021-08-24 | Discharge: 2021-08-24 | Disposition: A | Discharge status: Home / Self Care | Payer: BC Managed Care – PPO | Attending: Obstetrics and Gynecology | Admitting: Obstetrics and Gynecology

## 2021-08-24 ENCOUNTER — Other Ambulatory Visit: Payer: Self-pay

## 2021-08-24 ENCOUNTER — Ambulatory Visit (INDEPENDENT_AMBULATORY_CARE_PROVIDER_SITE_OTHER): Payer: BC Managed Care – PPO | Admitting: Nurse Practitioner

## 2021-08-24 ENCOUNTER — Telehealth: Payer: Self-pay

## 2021-08-24 VITALS — BP 102/64 | HR 72 | Wt 194.4 lb

## 2021-08-24 DIAGNOSIS — O26892 Other specified pregnancy related conditions, second trimester: Secondary | ICD-10-CM

## 2021-08-24 DIAGNOSIS — M7918 Myalgia, other site: Secondary | ICD-10-CM | POA: Insufficient documentation

## 2021-08-24 DIAGNOSIS — O26852 Spotting complicating pregnancy, second trimester: Secondary | ICD-10-CM | POA: Insufficient documentation

## 2021-08-24 DIAGNOSIS — Z3481 Encounter for supervision of other normal pregnancy, first trimester: Secondary | ICD-10-CM

## 2021-08-24 DIAGNOSIS — Z3A15 15 weeks gestation of pregnancy: Secondary | ICD-10-CM

## 2021-08-24 DIAGNOSIS — M545 Low back pain, unspecified: Secondary | ICD-10-CM | POA: Insufficient documentation

## 2021-08-24 DIAGNOSIS — O26851 Spotting complicating pregnancy, first trimester: Secondary | ICD-10-CM | POA: Diagnosis not present

## 2021-08-24 DIAGNOSIS — G8929 Other chronic pain: Secondary | ICD-10-CM

## 2021-08-24 DIAGNOSIS — O4692 Antepartum hemorrhage, unspecified, second trimester: Secondary | ICD-10-CM | POA: Diagnosis not present

## 2021-08-24 DIAGNOSIS — Z6740 Type O blood, Rh positive: Secondary | ICD-10-CM | POA: Insufficient documentation

## 2021-08-24 DIAGNOSIS — O99210 Obesity complicating pregnancy, unspecified trimester: Secondary | ICD-10-CM

## 2021-08-24 DIAGNOSIS — Z3A23 23 weeks gestation of pregnancy: Secondary | ICD-10-CM

## 2021-08-24 DIAGNOSIS — R102 Pelvic and perineal pain: Secondary | ICD-10-CM

## 2021-08-24 LAB — COMPREHENSIVE METABOLIC PANEL
ALT: 13 U/L (ref 0–44)
AST: 22 U/L (ref 15–41)
Albumin: 3.2 g/dL — ABNORMAL LOW (ref 3.5–5.0)
Alkaline Phosphatase: 39 U/L (ref 38–126)
Anion gap: 7 (ref 5–15)
BUN: 7 mg/dL (ref 6–20)
CO2: 22 mmol/L (ref 22–32)
Calcium: 8.6 mg/dL — ABNORMAL LOW (ref 8.9–10.3)
Chloride: 105 mmol/L (ref 98–111)
Creatinine, Ser: 0.44 mg/dL (ref 0.44–1.00)
GFR, Estimated: >60 mL/min (ref >60)
Glucose, Bld: 94 mg/dL (ref 70–99)
Potassium: 3.5 mmol/L (ref 3.5–5.1)
Sodium: 134 mmol/L — ABNORMAL LOW (ref 135–145)
Total Bilirubin: 0.6 mg/dL (ref 0.3–1.2)
Total Protein: 6.1 g/dL — ABNORMAL LOW (ref 6.5–8.1)

## 2021-08-24 LAB — URINALYSIS, ROUTINE W REFLEX MICROSCOPIC
Bilirubin Urine: NEGATIVE
Glucose, UA: NEGATIVE mg/dL
Ketones, ur: NEGATIVE mg/dL
Nitrite: NEGATIVE
Protein, ur: NEGATIVE mg/dL
Specific Gravity, Urine: 1.01 (ref 1.005–1.030)
pH: 6 (ref 5.0–8.0)

## 2021-08-24 LAB — CBC WITH DIFFERENTIAL/PLATELET
Abs Immature Granulocytes: 0.02 10*3/uL (ref 0.00–0.07)
Basophils Absolute: 0 10*3/uL (ref 0.0–0.1)
Basophils Relative: 0 %
Eosinophils Absolute: 0.1 10*3/uL (ref 0.0–0.5)
Eosinophils Relative: 1 %
HCT: 35.9 % — ABNORMAL LOW (ref 36.0–46.0)
Hemoglobin: 12.3 g/dL (ref 12.0–15.0)
Immature Granulocytes: 0 %
Lymphocytes Relative: 22 %
Lymphs Abs: 1.7 10*3/uL (ref 0.7–4.0)
MCH: 29.4 pg (ref 26.0–34.0)
MCHC: 34.3 g/dL (ref 30.0–36.0)
MCV: 85.9 fL (ref 80.0–100.0)
Monocytes Absolute: 0.4 10*3/uL (ref 0.1–1.0)
Monocytes Relative: 5 %
Neutro Abs: 5.6 10*3/uL (ref 1.7–7.7)
Neutrophils Relative %: 72 %
Platelets: 186 10*3/uL (ref 150–400)
RBC: 4.18 MIL/uL (ref 3.87–5.11)
RDW: 12.5 % (ref 11.5–15.5)
WBC: 7.8 10*3/uL (ref 4.0–10.5)
nRBC: 0 % (ref 0.0–0.2)

## 2021-08-24 LAB — URINALYSIS, MICROSCOPIC (REFLEX)

## 2021-08-24 MED ORDER — CYCLOBENZAPRINE HCL 10 MG PO TABS
10.0000 mg | ORAL_TABLET | Freq: Two times a day (BID) | ORAL | 0 refills | Status: DC | PRN
Start: 2021-08-24 — End: 2021-09-21

## 2021-08-24 MED ORDER — CYCLOBENZAPRINE HCL 5 MG PO TABS
10.0000 mg | ORAL_TABLET | Freq: Once | ORAL | Status: AC
  Administered 2021-08-24: 10 mg via ORAL
  Filled 2021-08-24: qty 2

## 2021-08-24 MED ORDER — ACETAMINOPHEN 500 MG PO TABS
1000.0000 mg | ORAL_TABLET | Freq: Once | ORAL | Status: AC
Start: 2021-08-24 — End: 2021-08-24
  Administered 2021-08-24: 1000 mg via ORAL
  Filled 2021-08-24: qty 2

## 2021-08-24 MED ORDER — ACETAMINOPHEN 325 MG PO TABS: 650.0000 mg | ORAL_TABLET | ORAL | 0 refills | Status: DC | PRN

## 2021-08-24 NOTE — Progress Notes (Addendum)
° ° °  Subjective:  Melissa Foster is a 26 y.o. G3P1011 at [redacted]w[redacted]d being seen today for ongoing prenatal care.  She is currently monitored for the following issues for this low-risk pregnancy and has Encounter for supervision of normal pregnancy in first trimester and Maternal obesity affecting pregnancy, antepartum on their problem list.  Patient reports backache.  Contractions: Irritability. Vag. Bleeding: None.  Movement: Present. Denies leaking of fluid.   The following portions of the patient's history were reviewed and updated as appropriate: allergies, current medications, past family history, past medical history, past social history, past surgical history and problem list. Problem list updated.  Objective:   Vitals:   08/24/21 1617  BP: 102/64  Pulse: 72  Weight: 194 lb 6.4 oz (88.2 kg)    Fetal Status: Fetal Heart Rate (bpm): 151   Movement: Present     General:  Alert, oriented and cooperative. Patient is in no acute distress.  Skin: Skin is warm and dry. No rash noted.   Cardiovascular: Normal heart rate noted  Respiratory: Normal respiratory effort, no problems with respiration noted  Abdomen: Soft, gravid, appropriate for gestational age. Pain/Pressure: Present     Pelvic:  Cervical exam deferred        Extremities: Normal range of motion.  Edema: None  Mental Status: Normal mood and affect. Normal behavior. Normal judgment and thought content.   Urinalysis:      Assessment and Plan:  Pregnancy: G3P1011 at [redacted]w[redacted]d  1. Encounter for supervision of other normal pregnancy in first trimester Went ot MAU today - reports her back is no better  Reviewed the medication she got today and she did report that she did feel better initially Reviewed getting her medication at the pharmacy and taking it again this evening Continues to see scant brown spotting when wiping only - reports she discussed this while at her MAU visit - stable amount of bleeding - has not increased  and has been present since visit at MAU on 08-18-21  - AFP, Serum, Open Spina Bifida  2. Pelvic pain affecting pregnancy in second trimester, antepartum Has midline back pain that refers to groin in front bilaterally No pain with palpation of pubic symphysis Will refer for PT - reviewed the [place] office will call her but the appointment will be at [facility]  - Ambulatory referral to Physical Therapy  3. Chronic midline low back pain without sciatica Advised additionally to use ice to her back daily for 15 minutes See info in AVS  4. [redacted] weeks gestation of pregnancy   Preterm labor symptoms and general obstetric precautions including but not limited to vaginal bleeding, contractions, leaking of fluid and fetal movement were reviewed in detail with the patient. Please refer to After Visit Summary for other counseling recommendations.  Return in about 4 weeks (around 09/21/2021) for in person ROB.  Nolene Bernheim, RN, MSN, NP-BC Nurse Practitioner, [facility] for [employer], [facility] 08/24/2021 4:33 PM

## 2021-08-24 NOTE — Telephone Encounter (Signed)
Patient states that she is having really bad right side and lower back pain. She states that she also has a hard time walking due to the pain. Patient also complains of having some vaginal bleeding that has continued since last week. Patient rates pain at about a 5-6/10. Patient advised to go back to MAU for evaluation.

## 2021-08-24 NOTE — MAU Provider Note (Signed)
History     CSN: [id number]  Arrival date and time: 08/24/21 1203   Event Date/Time   First Provider Initiated Contact with Patient 08/24/21 1437      Chief Complaint  Patient presents with   Back Pain   Vaginal Bleeding   HPI Melissa Foster is a 26 y.o. G3P1011 at 53w0dwho presents to MAU with chief complaint of low back pain. This is a recurrent problem, onset about 5 weeks ago. Patient's pain starts near her SI joint and radiates laterally to her right periumbilical region. Pain score is 7 for both her low back and her pelvis. Patient prefers to avoid medication and so has not taken medication or tried other treatments for this complaint. She denies aggravating or alleviating factors.  Patient also reports irregular vaginal spotting. This is a recurrent problem, onset last week. She is not wearing a pad and is only notices dark brown spotting when she wipes after voiding. She is remote from sexual intercourse.  Patient works in [employer] development [employer] She states her work shifts involve a significant amount of standing, walking, lifting and twisting.  Patient receives care with [facility]  OB History     Gravida  3   Para  1   Term  1   Preterm      AB  1   Living  1      SAB  1   IAB      Ectopic      Multiple  0   Live Births  1           Past Medical History:  Diagnosis Date   Medical history non-contributory     Past Surgical History:  Procedure Laterality Date   NO PAST SURGERIES      Family History  Problem Relation Age of Onset   Healthy Mother    Hypertension Father     Social History   Tobacco Use   Smoking status: Never   Smokeless tobacco: Never  Vaping Use   Vaping Use: Never used  Substance Use Topics   Alcohol use: Not Currently    Comment: occasionally prior to pregnancy   Drug use: No    Allergies: No Known Allergies  Medications Prior to Admission  Medication Sig Dispense Refill Last Dose    aspirin EC 81 MG tablet Take 1 tablet (81 mg total) by mouth daily. Take after 12 weeks for prevention of preeclampsia later in pregnancy 300 tablet 2 08/24/2021   Doxylamine-Pyridoxine (DICLEGIS) 10-10 MG TBEC Take 2 tablets by mouth at bedtime. If symptoms persist, add one tablet in the morning and one in the afternoon 100 tablet 5 Past Month   Prenat-Fe Poly-Methfol-FA-DHA (VITAFOL ULTRA) 29-0.6-0.4-200 MG CAPS Take 1 tablet by mouth daily. 30 capsule 12 08/24/2021   Blood Pressure Monitoring (BLOOD PRESSURE KIT) DEVI 1 kit by Does not apply route once a week. (Patient not taking: Reported on 07/26/2021) 1 each 0    Misc. Devices (GOJJI WEIGHT SCALE) MISC 1 Device by Does not apply route every 30 (thirty) days. (Patient not taking: Reported on 07/26/2021) 1 each 0    promethazine (PHENERGAN) 25 MG tablet Take 1 tablet (25 mg total) by mouth every 6 (six) hours as needed for nausea or vomiting. 30 tablet 2 More than a month    Review of Systems  Gastrointestinal:  Positive for abdominal pain.  Genitourinary:  Positive for pelvic pain and vaginal bleeding.  Musculoskeletal:  Positive for back  pain.  All other systems reviewed and are negative. Physical Exam   Blood pressure (!) 108/51, pulse 68, temperature 98.9 F (37.2 C), temperature source Oral, resp. rate 16, height '5\' 5"'  (1.651 m), weight 87.6 kg, last menstrual period 04/11/2021, SpO2 100 %.  Physical Exam Vitals and nursing note reviewed. Exam conducted with a chaperone present.  Constitutional:      General: She is not in acute distress.    Appearance: Normal appearance. She is not ill-appearing.  Cardiovascular:     Rate and Rhythm: Normal rate and regular rhythm.     Pulses: Normal pulses.     Heart sounds: Normal heart sounds.  Pulmonary:     Effort: Pulmonary effort is normal.     Breath sounds: Normal breath sounds.  Abdominal:     Tenderness: There is no abdominal tenderness. There is no right CVA tenderness, left CVA  tenderness, guarding or rebound.     Hernia: No hernia is present.     Comments: Gravid  Skin:    Capillary Refill: Capillary refill takes less than 2 seconds.  Neurological:     Mental Status: She is alert and oriented to person, place, and time.  Psychiatric:        Mood and Affect: Mood normal.        Behavior: Behavior normal.        Thought Content: Thought content normal.        Judgment: Judgment normal.    MAU Course/MDM  Procedures  --Pertinent negatives: abdominal tenderness, CVAT, elevated WBCs --Patient resting comfortably prior to discharge --No anatomy scan in current pregnancy. MFM OB Limited ordered. Posterior placenta, no abnormal findings --Abnormal UA without dysuria, flank pain, fever. Presence of hgb likely cross contamination. Will send culture  Orders Placed This Encounter  Procedures   Culture, OB Urine   [place] MFM OB Limited   Urinalysis, Routine w reflex microscopic Urine, Clean Catch   CBC with Differential/Platelet   Comprehensive metabolic panel   Urinalysis, Microscopic (reflex)   Discharge patient   Patient Vitals for the past 24 hrs:  BP Temp Temp src Pulse Resp SpO2 Height Weight  08/24/21 1450 (!) 111/53 -- -- 70 16 100 % -- --  08/24/21 1252 (!) 108/51 -- -- 68 16 100 % -- --  08/24/21 1220 (!) 113/58 98.9 F (37.2 C) Oral 78 16 100 % '5\' 5"'  (1.651 m) 87.6 kg   Results for orders placed or performed during the hospital encounter of 08/24/21 (from the past 24 hour(s))  Urinalysis, Routine w reflex microscopic Urine, Clean Catch     Status: Abnormal   Collection Time: 08/24/21 12:46 PM  Result Value Ref Range   Color, Urine YELLOW YELLOW   APPearance CLEAR CLEAR   Specific Gravity, Urine 1.010 1.005 - 1.030   pH 6.0 5.0 - 8.0   Glucose, UA NEGATIVE NEGATIVE mg/dL   Hgb urine dipstick SMALL (A) NEGATIVE   Bilirubin Urine NEGATIVE NEGATIVE   Ketones, ur NEGATIVE NEGATIVE mg/dL   Protein, ur NEGATIVE NEGATIVE mg/dL   Nitrite NEGATIVE  NEGATIVE   Leukocytes,Ua LARGE (A) NEGATIVE  Urinalysis, Microscopic (reflex)     Status: Abnormal   Collection Time: 08/24/21 12:46 PM  Result Value Ref Range   RBC / HPF 0-5 0 - 5 RBC/hpf   WBC, UA 0-5 0 - 5 WBC/hpf   Bacteria, UA FEW (A) NONE SEEN   Squamous Epithelial / LPF 0-5 0 - 5  CBC with Differential/Platelet  Status: Abnormal   Collection Time: 08/24/21 12:49 PM  Result Value Ref Range   WBC 7.8 4.0 - 10.5 K/uL   RBC 4.18 3.87 - 5.11 MIL/uL   Hemoglobin 12.3 12.0 - 15.0 g/dL   HCT 35.9 (L) 36.0 - 46.0 %   MCV 85.9 80.0 - 100.0 fL   MCH 29.4 26.0 - 34.0 pg   MCHC 34.3 30.0 - 36.0 g/dL   RDW 12.5 11.5 - 15.5 %   Platelets 186 150 - 400 K/uL   nRBC 0.0 0.0 - 0.2 %   Neutrophils Relative % 72 %   Neutro Abs 5.6 1.7 - 7.7 K/uL   Lymphocytes Relative 22 %   Lymphs Abs 1.7 0.7 - 4.0 K/uL   Monocytes Relative 5 %   Monocytes Absolute 0.4 0.1 - 1.0 K/uL   Eosinophils Relative 1 %   Eosinophils Absolute 0.1 0.0 - 0.5 K/uL   Basophils Relative 0 %   Basophils Absolute 0.0 0.0 - 0.1 K/uL   Immature Granulocytes 0 %   Abs Immature Granulocytes 0.02 0.00 - 0.07 K/uL  Comprehensive metabolic panel     Status: Abnormal   Collection Time: 08/24/21 12:49 PM  Result Value Ref Range   Sodium 134 (L) 135 - 145 mmol/L   Potassium 3.5 3.5 - 5.1 mmol/L   Chloride 105 98 - 111 mmol/L   CO2 22 22 - 32 mmol/L   Glucose, Bld 94 70 - 99 mg/dL   BUN 7 6 - 20 mg/dL   Creatinine, Ser 0.44 0.44 - 1.00 mg/dL   Calcium 8.6 (L) 8.9 - 10.3 mg/dL   Total Protein 6.1 (L) 6.5 - 8.1 g/dL   Albumin 3.2 (L) 3.5 - 5.0 g/dL   AST 22 15 - 41 U/L   ALT 13 0 - 44 U/L   Alkaline Phosphatase 39 38 - 126 U/L   Total Bilirubin 0.6 0.3 - 1.2 mg/dL   GFR, Estimated >60 >60 mL/min   Anion gap 7 5 - 15   Meds ordered this encounter  Medications   acetaminophen (TYLENOL) tablet 1,000 mg   cyclobenzaprine (FLEXERIL) tablet 10 mg   acetaminophen (TYLENOL) 325 MG tablet    Sig: Take 2 tablets (650 mg  total) by mouth every 4 (four) hours as needed.    Dispense:  180 tablet    Refill:  0    Order Specific Question:   Supervising Provider    Answer:   Llana Aliment   cyclobenzaprine (FLEXERIL) 10 MG tablet    Sig: Take 1 tablet (10 mg total) by mouth 2 (two) times daily as needed for muscle spasms.    Dispense:  20 tablet    Refill:  0    Order Specific Question:   Supervising Provider    Answer:   Radene Gunning [[id number]]    Assessment and Plan  --26 y.o. G3P1011 at [redacted]w[redacted]d --FHT 150 by Doppler --Normal [place]OB Limited --Vaginal spotting --Blood type O POS --Hgb 12.3 --Musculoskeletal pain improved with Tylenol and Flexeril, rx to pharmacy --Urine culture in work --Discharge home in stable condition  SDarlina Rumpf CNM 08/24/2021, 3:40 PM

## 2021-08-24 NOTE — MAU Note (Signed)
Called Main Lab regarding patients UA that was sent one hour ago. Lab stated they could not find the sample.

## 2021-08-24 NOTE — MAU Note (Signed)
Been having a lot of pain in low back, comes around to the front.  Started yesterday. Worse today, hard to walk at times.  Seeing brown when she wipes,  pink on Saturday, then brown today, was having the pain so thought she should come in.

## 2021-08-26 LAB — AFP, SERUM, OPEN SPINA BIFIDA
AFP MoM: 0.86
AFP Value: 22.6 ng/mL
Gest. Age on Collection Date: 15 weeks
Maternal Age At EDD: 24.6 yr
OSBR Risk 1 IN: 10000
Test Results:: NEGATIVE
Weight: 194 [lb_av]

## 2021-08-26 LAB — CULTURE, OB URINE: Culture: 30000 — AB

## 2021-09-21 ENCOUNTER — Ambulatory Visit: Payer: BC Managed Care – PPO | Attending: Obstetrics and Gynecology

## 2021-09-21 ENCOUNTER — Ambulatory Visit: Payer: BC Managed Care – PPO | Admitting: *Deleted

## 2021-09-21 ENCOUNTER — Ambulatory Visit (INDEPENDENT_AMBULATORY_CARE_PROVIDER_SITE_OTHER): Payer: BC Managed Care – PPO | Admitting: Advanced Practice Midwife

## 2021-09-21 ENCOUNTER — Other Ambulatory Visit: Payer: Self-pay

## 2021-09-21 VITALS — BP 110/68 | HR 74 | Wt 194.0 lb

## 2021-09-21 VITALS — BP 118/57 | HR 69

## 2021-09-21 DIAGNOSIS — Z3481 Encounter for supervision of other normal pregnancy, first trimester: Secondary | ICD-10-CM | POA: Diagnosis not present

## 2021-09-21 DIAGNOSIS — Z363 Encounter for antenatal screening for malformations: Secondary | ICD-10-CM | POA: Diagnosis present

## 2021-09-21 DIAGNOSIS — Z3A19 19 weeks gestation of pregnancy: Secondary | ICD-10-CM

## 2021-09-21 DIAGNOSIS — O99210 Obesity complicating pregnancy, unspecified trimester: Secondary | ICD-10-CM | POA: Insufficient documentation

## 2021-09-21 DIAGNOSIS — O99212 Obesity complicating pregnancy, second trimester: Secondary | ICD-10-CM | POA: Diagnosis not present

## 2021-09-21 NOTE — Progress Notes (Signed)
° °  PRENATAL VISIT NOTE  Subjective:  Melissa Foster is a 26 y.o. G3P1011 at [redacted]w[redacted]d being seen today for ongoing prenatal care.  She is currently monitored for the following issues for this low-risk pregnancy and has Encounter for supervision of normal pregnancy in first trimester and Maternal obesity affecting pregnancy, antepartum on their problem list.  Patient reports no complaints.  Contractions: Not present. Vag. Bleeding: None.  Movement: Present. Denies leaking of fluid.   The following portions of the patient's history were reviewed and updated as appropriate: allergies, current medications, past family history, past medical history, past social history, past surgical history and problem list.   Objective:   Vitals:   09/21/21 1353  BP: 110/68  Pulse: 74  Weight: 194 lb (88 kg)    Fetal Status:     Movement: Present     General:  Alert, oriented and cooperative. Patient is in no acute distress.  Skin: Skin is warm and dry. No rash noted.   Cardiovascular: Normal heart rate noted  Respiratory: Normal respiratory effort, no problems with respiration noted  Abdomen: Soft, gravid, appropriate for gestational age.  Pain/Pressure: Absent     Pelvic: Cervical exam deferred        Extremities: Normal range of motion.  Edema: None  Mental Status: Normal mood and affect. Normal behavior. Normal judgment and thought content.   Assessment and Plan:  Pregnancy: G3P1011 at [redacted]w[redacted]d 1. Encounter for supervision of other normal pregnancy in first trimester --Anticipatory guidance about next visits/weeks of pregnancy given.  --Next visit in 4 weeks --anatomy US today   2. [redacted] weeks gestation of pregnancy   Preterm labor symptoms and general obstetric precautions including but not limited to vaginal bleeding, contractions, leaking of fluid and fetal movement were reviewed in detail with the patient. Please refer to After Visit Summary for other counseling recommendations.    Return in about 4 weeks (around 10/19/2021).  Future Appointments  Date Time Provider Department Center  09/21/2021  2:30 PM [facility] NURSE [facility]  09/21/2021  2:45 PM [facility]4 [facility]    Sharen Counter, CNM

## 2021-09-21 NOTE — Progress Notes (Deleted)
° °  PRENATAL VISIT NOTE  Subjective:  Melissa Foster is a 26 y.o. G3P1011 at [redacted]w[redacted]d being seen today for ongoing prenatal care.  She is currently monitored for the following issues for this {Blank single:19197::"high-risk","low-risk"} pregnancy and has Encounter for supervision of normal pregnancy in first trimester and Maternal obesity affecting pregnancy, antepartum on their problem list.  Patient reports {sx:14538}.   .  .   . Denies leaking of fluid.   The following portions of the patient's history were reviewed and updated as appropriate: allergies, current medications, past family history, past medical history, past social history, past surgical history and problem list.   Objective:  There were no vitals filed for this visit.  Fetal Status:           General:  Alert, oriented and cooperative. Patient is in no acute distress.  Skin: Skin is warm and dry. No rash noted.   Cardiovascular: Normal heart rate noted  Respiratory: Normal respiratory effort, no problems with respiration noted  Abdomen: Soft, gravid, appropriate for gestational age.        Pelvic: {Blank single:19197::"Cervical exam performed in the presence of a chaperone","Cervical exam deferred"}        Extremities: Normal range of motion.     Mental Status: Normal mood and affect. Normal behavior. Normal judgment and thought content.   Assessment and Plan:  Pregnancy: G3P1011 at [redacted]w[redacted]d 1. Encounter for supervision of other normal pregnancy in first trimester ***  2. [redacted] weeks gestation of pregnancy ***  {Blank single:19197::"Term","Preterm"} labor symptoms and general obstetric precautions including but not limited to vaginal bleeding, contractions, leaking of fluid and fetal movement were reviewed in detail with the patient. Please refer to After Visit Summary for other counseling recommendations.   No follow-ups on file.  Future Appointments  Date Time Provider Department Center  09/21/2021  2:30 PM  [facility] NURSE [facility]  09/21/2021  2:45 PM [facility] US4 [facility]    Sharen Counter, CNM

## 2021-10-18 ENCOUNTER — Other Ambulatory Visit: Payer: Self-pay

## 2021-10-18 ENCOUNTER — Ambulatory Visit (INDEPENDENT_AMBULATORY_CARE_PROVIDER_SITE_OTHER): Payer: BC Managed Care – PPO | Admitting: Obstetrics and Gynecology

## 2021-10-18 VITALS — BP 106/68 | HR 83 | Wt 200.0 lb

## 2021-10-18 DIAGNOSIS — O99210 Obesity complicating pregnancy, unspecified trimester: Secondary | ICD-10-CM

## 2021-10-18 DIAGNOSIS — Z3481 Encounter for supervision of other normal pregnancy, first trimester: Secondary | ICD-10-CM

## 2021-10-18 NOTE — Progress Notes (Signed)
° °  PRENATAL VISIT NOTE  Subjective:  Melissa Foster is a 26 y.o. G3P1011 at [redacted]w[redacted]d being seen today for ongoing prenatal care.  She is currently monitored for the following issues for this high-risk pregnancy and has Encounter for supervision of normal pregnancy in first trimester and Maternal obesity affecting pregnancy, antepartum on their problem list.  Patient reports no complaints.  Contractions: Not present.  .  Movement: Present. Denies leaking of fluid.   The following portions of the patient's history were reviewed and updated as appropriate: allergies, current medications, past family history, past medical history, past social history, past surgical history and problem list.   Objective:   Vitals:   10/18/21 1543  BP: 106/68  Pulse: 83  Weight: 200 lb (90.7 kg)    Fetal Status: Fetal Heart Rate (bpm): 136 Fundal Height: 24 cm Movement: Present     General:  Alert, oriented and cooperative. Patient is in no acute distress.  Skin: Skin is warm and dry. No rash noted.   Cardiovascular: Normal heart rate noted  Respiratory: Normal respiratory effort, no problems with respiration noted  Abdomen: Soft, gravid, appropriate for gestational age.  Pain/Pressure: Absent     Pelvic: Cervical exam deferred        Extremities: Normal range of motion.  Edema: None  Mental Status: Normal mood and affect. Normal behavior. Normal judgment and thought content.   Assessment and Plan:  Pregnancy: G3P1011 at [redacted]w[redacted]d 1. Encounter for supervision of other normal pregnancy in first trimester Patient is doing well without complaints Third trimester labs with glucola next visit  2. Maternal obesity affecting pregnancy, antepartum Patient to start ASA- previously withheld by pharmacist due to pregnancy  Preterm labor symptoms and general obstetric precautions including but not limited to vaginal bleeding, contractions, leaking of fluid and fetal movement were reviewed in detail with the  patient. Please refer to After Visit Summary for other counseling recommendations.   Return in about 4 weeks (around 11/15/2021) for 2 hr glucola next visit, in person, ROB, Low risk.  Future Appointments  Date Time Provider [place]  10/18/2021  4:10 PM Chablis Losh, Vickii Chafe, MD [place] None  11/15/2021  8:35 AM Exander Shaul, Vickii Chafe, MD [facility] None  11/29/2021  4:10 PM Jaleigh Mccroskey, Vickii Chafe, MD [place] None  12/13/2021  4:10 PM Radene Gunning, MD [facility] None    Melissa Bellman, MD

## 2021-10-18 NOTE — Progress Notes (Signed)
ROB 22.[redacted] wks GA Reports round ligament pain right side, advise pregnancy support belt.

## 2021-11-15 ENCOUNTER — Encounter: Payer: Self-pay | Admitting: Obstetrics and Gynecology

## 2021-11-15 ENCOUNTER — Ambulatory Visit (INDEPENDENT_AMBULATORY_CARE_PROVIDER_SITE_OTHER): Payer: BC Managed Care – PPO | Admitting: Obstetrics and Gynecology

## 2021-11-15 ENCOUNTER — Other Ambulatory Visit: Payer: Self-pay

## 2021-11-15 VITALS — BP 99/65 | HR 73 | Wt 199.0 lb

## 2021-11-15 DIAGNOSIS — Z3481 Encounter for supervision of other normal pregnancy, first trimester: Secondary | ICD-10-CM

## 2021-11-15 DIAGNOSIS — O99210 Obesity complicating pregnancy, unspecified trimester: Secondary | ICD-10-CM

## 2021-11-15 NOTE — Progress Notes (Signed)
? ?  PRENATAL VISIT NOTE ? ?Subjective:  ?Melissa Foster is a 26 y.o. G3P1011 at [redacted]w[redacted]d being seen today for ongoing prenatal care.  She is currently monitored for the following issues for this low-risk pregnancy and has Encounter for supervision of normal pregnancy in first trimester and Maternal obesity affecting pregnancy, antepartum on their problem list. ? ?Patient reports no complaints.  Contractions: Not present. Vag. Bleeding: None.  Movement: Present. Denies leaking of fluid.  ? ?The following portions of the patient's history were reviewed and updated as appropriate: allergies, current medications, past family history, past medical history, past social history, past surgical history and problem list.  ? ?Objective:  ? ?Vitals:  ? 11/15/21 0825  ?BP: 99/65  ?Pulse: 73  ?Weight: 199 lb (90.3 kg)  ? ? ?Fetal Status: Fetal Heart Rate (bpm): 140 Fundal Height: 27 cm Movement: Present    ? ?General:  Alert, oriented and cooperative. Patient is in no acute distress.  ?Skin: Skin is warm and dry. No rash noted.   ?Cardiovascular: Normal heart rate noted  ?Respiratory: Normal respiratory effort, no problems with respiration noted  ?Abdomen: Soft, gravid, appropriate for gestational age.  Pain/Pressure: Absent     ?Pelvic: Cervical exam deferred        ?Extremities: Normal range of motion.  Edema: None  ?Mental Status: Normal mood and affect. Normal behavior. Normal judgment and thought content.  ? ?Assessment and Plan:  ?Pregnancy: G3P1011 at [redacted]w[redacted]d ?1. Encounter for supervision of other normal pregnancy in first trimester ?Patient is doing well ?Third trimester labs with glucola today ? ?2. Maternal obesity affecting pregnancy, antepartum ?Continue ASA ?Follow up growth ultrasound ? ?Preterm labor symptoms and general obstetric precautions including but not limited to vaginal bleeding, contractions, leaking of fluid and fetal movement were reviewed in detail with the patient. ?Please refer to After Visit  Summary for other counseling recommendations.  ? ?Return in about 2 weeks (around 11/29/2021) for in person, ROB, Low risk. ? ?Future Appointments  ?Date Time Provider Department Center  ?11/29/2021  4:10 PM Jinny Sweetland, Gigi Gin, MD [facility] None  ?12/13/2021  4:10 PM Milas Hock, MD [facility] None  ? ? ?Catalina Antigua, MD ?' ?

## 2021-11-16 LAB — CBC
Hematocrit: 37.1 % (ref 34.0–46.6)
Hemoglobin: 12.8 g/dL (ref 11.1–15.9)
MCH: 30.3 pg (ref 26.6–33.0)
MCHC: 34.5 g/dL (ref 31.5–35.7)
MCV: 88 fL (ref 79–97)
Platelets: 212 10*3/uL (ref 150–450)
RBC: 4.22 x10E6/uL (ref 3.77–5.28)
RDW: 12.3 % (ref 11.7–15.4)
WBC: 8.5 10*3/uL (ref 3.4–10.8)

## 2021-11-16 LAB — HIV ANTIBODY (ROUTINE TESTING W REFLEX): HIV Screen 4th Generation wRfx: NONREACTIVE

## 2021-11-16 LAB — RPR: RPR Ser Ql: NONREACTIVE

## 2021-11-16 LAB — GLUCOSE TOLERANCE, 2 HOURS W/ 1HR
Glucose, 1 hour: 112 mg/dL (ref 70–179)
Glucose, 2 hour: 87 mg/dL (ref 70–152)
Glucose, Fasting: 78 mg/dL (ref 70–91)

## 2021-11-17 ENCOUNTER — Ambulatory Visit: Payer: BC Managed Care – PPO

## 2021-11-19 ENCOUNTER — Ambulatory Visit: Payer: BC Managed Care – PPO | Attending: Obstetrics and Gynecology | Admitting: *Deleted

## 2021-11-19 ENCOUNTER — Ambulatory Visit (HOSPITAL_BASED_OUTPATIENT_CLINIC_OR_DEPARTMENT_OTHER): Payer: BC Managed Care – PPO

## 2021-11-19 VITALS — BP 105/55 | HR 75

## 2021-11-19 DIAGNOSIS — Z363 Encounter for antenatal screening for malformations: Secondary | ICD-10-CM | POA: Diagnosis not present

## 2021-11-19 DIAGNOSIS — Z3481 Encounter for supervision of other normal pregnancy, first trimester: Secondary | ICD-10-CM

## 2021-11-19 DIAGNOSIS — O99210 Obesity complicating pregnancy, unspecified trimester: Secondary | ICD-10-CM

## 2021-11-19 DIAGNOSIS — O99212 Obesity complicating pregnancy, second trimester: Secondary | ICD-10-CM

## 2021-11-19 DIAGNOSIS — Z3A27 27 weeks gestation of pregnancy: Secondary | ICD-10-CM

## 2021-11-19 DIAGNOSIS — E669 Obesity, unspecified: Secondary | ICD-10-CM

## 2021-11-29 ENCOUNTER — Ambulatory Visit (INDEPENDENT_AMBULATORY_CARE_PROVIDER_SITE_OTHER): Payer: BC Managed Care – PPO | Admitting: Obstetrics and Gynecology

## 2021-11-29 ENCOUNTER — Encounter: Payer: Self-pay | Admitting: Obstetrics and Gynecology

## 2021-11-29 VITALS — BP 112/73 | HR 74 | Wt 201.7 lb

## 2021-11-29 DIAGNOSIS — Z3481 Encounter for supervision of other normal pregnancy, first trimester: Secondary | ICD-10-CM

## 2021-11-29 DIAGNOSIS — O99210 Obesity complicating pregnancy, unspecified trimester: Secondary | ICD-10-CM

## 2021-11-29 NOTE — Progress Notes (Signed)
? ?  PRENATAL VISIT NOTE ? ?Subjective:  ?Melissa Foster is a 26 y.o. G3P1011 at [redacted]w[redacted]d being seen today for ongoing prenatal care.  She is currently monitored for the following issues for this low-risk pregnancy and has Encounter for supervision of normal pregnancy in first trimester and Maternal obesity affecting pregnancy, antepartum on their problem list. ? ?Patient reports no complaints.  Contractions: Not present. Vag. Bleeding: None.  Movement: Present. Denies leaking of fluid.  ? ?The following portions of the patient's history were reviewed and updated as appropriate: allergies, current medications, past family history, past medical history, past social history, past surgical history and problem list.  ? ?Objective:  ? ?Vitals:  ? 11/29/21 1606  ?BP: 112/73  ?Pulse: 74  ?Weight: 201 lb 11.2 oz (91.5 kg)  ? ? ?Fetal Status: Fetal Heart Rate (bpm): 134 Fundal Height: 30 cm Movement: Present    ? ?General:  Alert, oriented and cooperative. Patient is in no acute distress.  ?Skin: Skin is warm and dry. No rash noted.   ?Cardiovascular: Normal heart rate noted  ?Respiratory: Normal respiratory effort, no problems with respiration noted  ?Abdomen: Soft, gravid, appropriate for gestational age.  Pain/Pressure: Absent     ?Pelvic: Cervical exam deferred        ?Extremities: Normal range of motion.  Edema: None  ?Mental Status: Normal mood and affect. Normal behavior. Normal judgment and thought content.  ? ?Assessment and Plan:  ?Pregnancy: G3P1011 at [redacted]w[redacted]d ?1. Encounter for supervision of other normal pregnancy in first trimester ?Patient is doing well without complaints ?Reviewed normal labs last visit ?No further ultrasound needed at this time ? ?2. Maternal obesity affecting pregnancy, antepartum ?Continue ASA ? ?Preterm labor symptoms and general obstetric precautions including but not limited to vaginal bleeding, contractions, leaking of fluid and fetal movement were reviewed in detail with the  patient. ?Please refer to After Visit Summary for other counseling recommendations.  ? ?Return in about 2 weeks (around 12/13/2021) for in person, ROB, Low risk. ? ?Future Appointments  ?Date Time Provider Department Center  ?12/13/2021  4:10 PM Milas Hock, MD [facility] None  ? ? ?Catalina Antigua, MD ? ?

## 2021-11-29 NOTE — Progress Notes (Signed)
Pt reports fetal movement, denies pain.  

## 2021-12-02 NOTE — Progress Notes (Deleted)
   PRENATAL VISIT NOTE  Subjective:  Melissa Foster is a 26 y.o. G3P1011 at 3***w***d being seen today for ongoing prenatal care.  She is currently monitored for the following issues for this low-risk pregnancy and has Encounter for supervision of normal pregnancy in first trimester and Maternal obesity affecting pregnancy, antepartum on their problem list.  Patient reports {sx:14538}.   .  .   . Denies leaking of fluid.   The following portions of the patient's history were reviewed and updated as appropriate: allergies, current medications, past family history, past medical history, past social history, past surgical history and problem list.   Objective:  There were no vitals filed for this visit.  Fetal Status:           General:  Alert, oriented and cooperative. Patient is in no acute distress.  Skin: Skin is warm and dry. No rash noted.   Cardiovascular: Normal heart rate noted  Respiratory: Normal respiratory effort, no problems with respiration noted  Abdomen: Soft, gravid, appropriate for gestational age.        Pelvic: Cervical exam deferred        Extremities: Normal range of motion.     Mental Status: Normal mood and affect. Normal behavior. Normal judgment and thought content.   Assessment and Plan:  Pregnancy: G3P1011 at 3***w***d 1. Encounter for supervision of other normal pregnancy in first trimester - 28w labs normal - Rh positive - Discussed tdap - pt ***  2. Maternal obesity affecting pregnancy, antepartum - 3/31 growth wnl  Preterm labor symptoms and general obstetric precautions including but not limited to vaginal bleeding, contractions, leaking of fluid and fetal movement were reviewed in detail with the patient. Please refer to After Visit Summary for other counseling recommendations.   No follow-ups on file.  Future Appointments  Date Time Provider Department Center  12/13/2021  4:10 PM Milas Hock, MD [facility] None    Milas Hock,  MD

## 2021-12-13 ENCOUNTER — Encounter: Payer: BC Managed Care – PPO | Admitting: Obstetrics and Gynecology

## 2021-12-13 DIAGNOSIS — Z3481 Encounter for supervision of other normal pregnancy, first trimester: Secondary | ICD-10-CM

## 2021-12-13 DIAGNOSIS — O99210 Obesity complicating pregnancy, unspecified trimester: Secondary | ICD-10-CM

## 2021-12-14 ENCOUNTER — Encounter: Payer: Self-pay | Admitting: Obstetrics and Gynecology

## 2021-12-14 ENCOUNTER — Ambulatory Visit (INDEPENDENT_AMBULATORY_CARE_PROVIDER_SITE_OTHER): Payer: BC Managed Care – PPO | Admitting: Obstetrics and Gynecology

## 2021-12-14 VITALS — BP 105/67 | HR 63 | Wt 201.7 lb

## 2021-12-14 DIAGNOSIS — O99213 Obesity complicating pregnancy, third trimester: Secondary | ICD-10-CM

## 2021-12-14 DIAGNOSIS — Z3A31 31 weeks gestation of pregnancy: Secondary | ICD-10-CM

## 2021-12-14 DIAGNOSIS — Z23 Encounter for immunization: Secondary | ICD-10-CM

## 2021-12-14 DIAGNOSIS — O99210 Obesity complicating pregnancy, unspecified trimester: Secondary | ICD-10-CM

## 2021-12-14 DIAGNOSIS — Z3481 Encounter for supervision of other normal pregnancy, first trimester: Secondary | ICD-10-CM

## 2021-12-14 NOTE — Progress Notes (Signed)
ROB 31.[redacted] wks GA ? ?Reports frequent headaches, has not tried tylenol, reassured that tylenol is safe in pregnancy. Denies visual changes, RUQ pain, dizziness. ? ?TDAP offered an accepted ?

## 2021-12-14 NOTE — Progress Notes (Signed)
? ?  PRENATAL VISIT NOTE ? ?Subjective:  ?Melissa Foster is a 26 y.o. G3P1011 at [redacted]w[redacted]d being seen today for ongoing prenatal care.  She is currently monitored for the following issues for this low-risk pregnancy and has Encounter for supervision of normal pregnancy in first trimester and Maternal obesity affecting pregnancy, antepartum on their problem list. ? ?Patient reports headaches.  Contractions: Not present. Vag. Bleeding: None.  Movement: Present. Denies leaking of fluid.  ? ?The following portions of the patient's history were reviewed and updated as appropriate: allergies, current medications, past family history, past medical history, past social history, past surgical history and problem list.  ? ?Objective:  ? ?Vitals:  ? 12/14/21 0943  ?BP: 105/67  ?Pulse: 63  ?Weight: 201 lb 11.2 oz (91.5 kg)  ? ? ?Fetal Status: Fetal Heart Rate (bpm): 125 Fundal Height: 31 cm Movement: Present    ? ?General:  Alert, oriented and cooperative. Patient is in no acute distress.  ?Skin: Skin is warm and dry. No rash noted.   ?Cardiovascular: Normal heart rate noted  ?Respiratory: Normal respiratory effort, no problems with respiration noted  ?Abdomen: Soft, gravid, appropriate for gestational age.  Pain/Pressure: Absent     ?Pelvic: Cervical exam deferred        ?Extremities: Normal range of motion.  Edema: None  ?Mental Status: Normal mood and affect. Normal behavior. Normal judgment and thought content.  ? ?Assessment and Plan:  ?Pregnancy: G3P1011 at [redacted]w[redacted]d ?1. Encounter for supervision of other normal pregnancy in first trimester ?Patient is doing well ?Encouraged increasing hydration and tylenol as needed ? ?2. Maternal obesity affecting pregnancy, antepartum ?Continue ASA ? ?Preterm labor symptoms and general obstetric precautions including but not limited to vaginal bleeding, contractions, leaking of fluid and fetal movement were reviewed in detail with the patient. ?Please refer to After Visit Summary for  other counseling recommendations.  ? ?Return in about 2 weeks (around 12/28/2021) for in person, ROB, Low risk. ? ?No future appointments. ? ?Catalina Antigua, MD ? ?

## 2021-12-29 NOTE — Progress Notes (Deleted)
   PRENATAL VISIT NOTE  Subjective:  Melissa Foster is a 26 y.o. G3P1011 at [redacted]w[redacted]d being seen today for ongoing prenatal care.  She is currently monitored for the following issues for this {Blank single:19197::"high-risk","low-risk"} pregnancy and has Encounter for supervision of normal pregnancy in first trimester and Maternal obesity affecting pregnancy, antepartum on their problem list.  Patient reports {sx:14538}.   .  .   . Denies leaking of fluid.   The following portions of the patient's history were reviewed and updated as appropriate: allergies, current medications, past family history, past medical history, past social history, past surgical history and problem list.   Objective:  There were no vitals filed for this visit.  Fetal Status:           General:  Alert, oriented and cooperative. Patient is in no acute distress.  Skin: Skin is warm and dry. No rash noted.   Cardiovascular: Normal heart rate noted  Respiratory: Normal respiratory effort, no problems with respiration noted  Abdomen: Soft, gravid, appropriate for gestational age.        Pelvic: {Blank single:19197::"Cervical exam performed in the presence of a chaperone","Cervical exam deferred"}        Extremities: Normal range of motion.     Mental Status: Normal mood and affect. Normal behavior. Normal judgment and thought content.   Assessment and Plan:  Pregnancy: G3P1011 at [redacted]w[redacted]d There are no diagnoses linked to this encounter. {Blank single:19197::"Term","Preterm"} labor symptoms and general obstetric precautions including but not limited to vaginal bleeding, contractions, leaking of fluid and fetal movement were reviewed in detail with the patient. Please refer to After Visit Summary for other counseling recommendations.   No follow-ups on file.  Future Appointments  Date Time Provider [place]  12/30/2021  3:30 PM Starr Lake, CNM [place] None    Mervyn Skeeters  [place], [place]

## 2021-12-30 ENCOUNTER — Encounter: Payer: BC Managed Care – PPO | Admitting: Student

## 2022-01-03 ENCOUNTER — Encounter: Payer: Self-pay | Admitting: Obstetrics and Gynecology

## 2022-01-03 ENCOUNTER — Ambulatory Visit (INDEPENDENT_AMBULATORY_CARE_PROVIDER_SITE_OTHER): Payer: BC Managed Care – PPO | Admitting: Obstetrics and Gynecology

## 2022-01-03 VITALS — BP 106/68 | HR 73 | Wt 204.0 lb

## 2022-01-03 DIAGNOSIS — Z3A33 33 weeks gestation of pregnancy: Secondary | ICD-10-CM

## 2022-01-03 DIAGNOSIS — Z3481 Encounter for supervision of other normal pregnancy, first trimester: Secondary | ICD-10-CM

## 2022-01-03 DIAGNOSIS — O99210 Obesity complicating pregnancy, unspecified trimester: Secondary | ICD-10-CM

## 2022-01-03 DIAGNOSIS — O99213 Obesity complicating pregnancy, third trimester: Secondary | ICD-10-CM

## 2022-01-03 NOTE — Progress Notes (Signed)
ROB, c/o right side pelvic pain 6-7/10 x 2 weeks. ?

## 2022-01-03 NOTE — Progress Notes (Signed)
? ?  PRENATAL VISIT NOTE ? ?Subjective:  ?Melissa Foster is a 26 y.o. G3P1011 at [redacted]w[redacted]d being seen today for ongoing prenatal care.  She is currently monitored for the following issues for this low-risk pregnancy and has Encounter for supervision of normal pregnancy in first trimester and Maternal obesity affecting pregnancy, antepartum on their problem list. ? ?Patient reports no complaints.  Contractions: Not present. Vag. Bleeding: None.  Movement: Present. Denies leaking of fluid.  ? ?The following portions of the patient's history were reviewed and updated as appropriate: allergies, current medications, past family history, past medical history, past social history, past surgical history and problem list.  ? ?Objective:  ? ?Vitals:  ? 01/03/22 0926  ?BP: 106/68  ?Pulse: 73  ?Weight: 204 lb (92.5 kg)  ? ? ?Fetal Status: Fetal Heart Rate (bpm): 137 Fundal Height: 33 cm Movement: Present    ? ?General:  Alert, oriented and cooperative. Patient is in no acute distress.  ?Skin: Skin is warm and dry. No rash noted.   ?Cardiovascular: Normal heart rate noted  ?Respiratory: Normal respiratory effort, no problems with respiration noted  ?Abdomen: Soft, gravid, appropriate for gestational age.  Pain/Pressure: Present     ?Pelvic: Cervical exam deferred        ?Extremities: Normal range of motion.  Edema: None  ?Mental Status: Normal mood and affect. Normal behavior. Normal judgment and thought content.  ? ?Assessment and Plan:  ?Pregnancy: G3P1011 at [redacted]w[redacted]d ?1. Encounter for supervision of other normal pregnancy in first trimester ?Patient is doing well without complaints ?Cultures next visit ? ?2. Maternal obesity affecting pregnancy, antepartum ?Continue ASA ? ?Preterm labor symptoms and general obstetric precautions including but not limited to vaginal bleeding, contractions, leaking of fluid and fetal movement were reviewed in detail with the patient. ?Please refer to After Visit Summary for other counseling  recommendations.  ? ?Return in about 2 weeks (around 01/17/2022) for in person, ROB, Low risk. ? ?No future appointments. ? ?Catalina Antigua, MD ? ?

## 2022-01-19 ENCOUNTER — Other Ambulatory Visit (HOSPITAL_COMMUNITY)
Admission: RE | Admit: 2022-01-19 | Discharge: 2022-01-19 | Disposition: A | Discharge status: Home / Self Care | Payer: BC Managed Care – PPO | Source: Ambulatory Visit | Attending: Women's Health | Admitting: Women's Health

## 2022-01-19 ENCOUNTER — Ambulatory Visit (INDEPENDENT_AMBULATORY_CARE_PROVIDER_SITE_OTHER): Payer: BC Managed Care – PPO | Admitting: Women's Health

## 2022-01-19 VITALS — BP 105/69 | HR 73 | Wt 207.0 lb

## 2022-01-19 DIAGNOSIS — Z3A36 36 weeks gestation of pregnancy: Secondary | ICD-10-CM

## 2022-01-19 DIAGNOSIS — O99210 Obesity complicating pregnancy, unspecified trimester: Secondary | ICD-10-CM

## 2022-01-19 DIAGNOSIS — Z3481 Encounter for supervision of other normal pregnancy, first trimester: Secondary | ICD-10-CM | POA: Insufficient documentation

## 2022-01-19 NOTE — Progress Notes (Signed)
Subjective:  Melissa Foster is a 26 y.o. G3P1011 at [redacted]w[redacted]d being seen today for ongoing prenatal care.  She is currently monitored for the following issues for this low-risk pregnancy and has Encounter for supervision of normal pregnancy in first trimester and Maternal obesity affecting pregnancy, antepartum on their problem list.  Patient reports no complaints.  Contractions: Not present. Vag. Bleeding: None.  Movement: Present. Denies leaking of fluid.   The following portions of the patient's history were reviewed and updated as appropriate: allergies, current medications, past family history, past medical history, past social history, past surgical history and problem list. Problem list updated.  Objective:   Vitals:   01/19/22 1432  BP: 105/69  Pulse: 73  Weight: 207 lb (93.9 kg)    Fetal Status: Fetal Heart Rate (bpm): 135 Fundal Height: 36 cm Movement: Present  Presentation: Vertex  General:  Alert, oriented and cooperative. Patient is in no acute distress.  Skin: Skin is warm and dry. No rash noted.   Cardiovascular: Normal heart rate noted  Respiratory: Normal respiratory effort, no problems with respiration noted  Abdomen: Soft, gravid, appropriate for gestational age. Pain/Pressure: Absent     Pelvic: Vag. Bleeding: None     Cervical exam performed Dilation: 1 Effacement (%): Thick Station: Ballotable  Extremities: Normal range of motion.  Edema: Trace  Mental Status: Normal mood and affect. Normal behavior. Normal judgment and thought content.  Chaperone present.  Urinalysis:      Assessment and Plan:  Pregnancy: G3P1011 at [redacted]w[redacted]d  1. Encounter for supervision of other normal pregnancy in first trimester - Cervicovaginal ancillary only( Melissa Foster) - Culture, beta strep (group b only)     11/15/2021    8:51 AM 06/12/2018    2:21 PM 06/05/2018   11:38 AM  PHQ9 SCORE ONLY  PHQ-9 Total Score 0 3 0      11/15/2021    8:51 AM 06/12/2018    2:21 PM  06/05/2018   11:38 AM 05/21/2018   11:18 AM  GAD 7 : Generalized Anxiety Score  Nervous, Anxious, on Edge 0 1 0 0  Control/stop worrying 0 0 0 0  Worry too much - different things 0 0 0 0  Trouble relaxing 0 1 0 0  Restless 0 0 0 0  Easily annoyed or irritable 0 1 1 1   Afraid - awful might happen 0 0 0 0  Total GAD 7 Score 0 3 1 1    2. Maternal obesity affecting pregnancy, antepartum  3. [redacted] weeks gestation of pregnancy  Term labor symptoms and general obstetric precautions including but not limited to vaginal bleeding, contractions, leaking of fluid and fetal movement were reviewed in detail with the patient. I discussed the assessment and treatment plan with the patient. The patient was provided an opportunity to ask questions and all were answered. The patient agreed with the plan and demonstrated an understanding of the instructions. The patient was advised to call back or seek an in-person office evaluation/go to MAU at [facility] for any urgent or concerning symptoms. Please refer to After Visit Summary for other counseling recommendations.  Return in about 1 week (around 01/26/2022) for in-person LOB/APP OK.   Melissa Foster, [facility], NP

## 2022-01-19 NOTE — Patient Instructions (Signed)
Maternity Assessment Unit (MAU)  The Maternity Assessment Unit (MAU) is located at the [facility] at [facility]. The address is: [address], [address], [place], [place] [address]. Please see map below for additional directions.    The Maternity Assessment Unit is designed to help you during your pregnancy, and for up to 6 weeks after delivery, with any pregnancy- or postpartum-related emergencies, if you think you are in labor, or if your water has broken. For example, if you experience nausea and vomiting, vaginal bleeding, severe abdominal or pelvic pain, elevated blood pressure or other problems related to your pregnancy or postpartum time, please come to the Maternity Assessment Unit for assistance.        

## 2022-01-20 LAB — CERVICOVAGINAL ANCILLARY ONLY
Chlamydia: NEGATIVE
Comment: NEGATIVE
Comment: NORMAL
Neisseria Gonorrhea: NEGATIVE

## 2022-01-23 LAB — CULTURE, BETA STREP (GROUP B ONLY): Strep Gp B Culture: NEGATIVE

## 2022-01-27 ENCOUNTER — Ambulatory Visit (INDEPENDENT_AMBULATORY_CARE_PROVIDER_SITE_OTHER): Payer: BC Managed Care – PPO | Admitting: Advanced Practice Midwife

## 2022-01-27 VITALS — BP 110/65 | HR 68 | Wt 210.2 lb

## 2022-01-27 DIAGNOSIS — O99210 Obesity complicating pregnancy, unspecified trimester: Secondary | ICD-10-CM

## 2022-01-27 DIAGNOSIS — Z3483 Encounter for supervision of other normal pregnancy, third trimester: Secondary | ICD-10-CM

## 2022-01-27 DIAGNOSIS — Z3A37 37 weeks gestation of pregnancy: Secondary | ICD-10-CM

## 2022-01-27 NOTE — Progress Notes (Signed)
Patient presents for ROB. Patient has no concerns today. 

## 2022-01-27 NOTE — Progress Notes (Signed)
   PRENATAL VISIT NOTE  Subjective:  Melissa Foster is a 26 y.o. G3P1011 at [redacted]w[redacted]d being seen today for ongoing prenatal care.  She is currently monitored for the following issues for this low-risk pregnancy and has Supervision of other normal pregnancy, antepartum and Maternal obesity affecting pregnancy, antepartum on their problem list.  Patient reports no complaints.  Contractions: Not present. Vag. Bleeding: None.  Movement: Present. Denies leaking of fluid.   Patient has new FMLA paperwork to complete as she is starting her maternity leave today.    The following portions of the patient's history were reviewed and updated as appropriate: allergies, current medications, past family history, past medical history, past social history, past surgical history and problem list. Problem list updated.  Objective:   Vitals:   01/27/22 1421  BP: 110/65  Pulse: 68  Weight: 210 lb 3.2 oz (95.3 kg)    Fetal Status: Fetal Heart Rate (bpm): 125 Fundal Height: 37 cm Movement: Present     General:  Alert, oriented and cooperative. Patient is in no acute distress.  Skin: Skin is warm and dry. No rash noted.   Cardiovascular: Normal heart rate noted  Respiratory: Normal respiratory effort, no problems with respiration noted  Abdomen: Soft, gravid, appropriate for gestational age.  Pain/Pressure: Absent     Pelvic: Cervical exam deferred        Extremities: Normal range of motion.  Edema: Trace  Mental Status: Normal mood and affect. Normal behavior. Normal judgment and thought content.   Assessment and Plan:  Pregnancy: G3P1011 at [redacted]w[redacted]d  1. Encounter for supervision of other normal pregnancy in third trimester - Routine care, previous baby SOL just before due date, declines discussion of induction planning  2. [redacted] weeks gestation of pregnancy   3. Maternal obesity affecting pregnancy, antepartum - FH appropriate, normal GTT - BMI 34.9  Term labor symptoms and general  obstetric precautions including but not limited to vaginal bleeding, contractions, leaking of fluid and fetal movement were reviewed in detail with the patient. Please refer to After Visit Summary for other counseling recommendations.  Return in about 1 week (around 02/03/2022) for MD or APP.  Future Appointments  Date Time Provider Department Center  02/03/2022 10:35 AM Raelyn Mora, CNM [facility] None    [place], [place]

## 2022-01-27 NOTE — Progress Notes (Deleted)
Patient presents for ROB. Patient has no concerns today. 

## 2022-02-03 ENCOUNTER — Ambulatory Visit (INDEPENDENT_AMBULATORY_CARE_PROVIDER_SITE_OTHER): Payer: BC Managed Care – PPO | Admitting: Obstetrics and Gynecology

## 2022-02-03 ENCOUNTER — Encounter: Payer: Self-pay | Admitting: Obstetrics and Gynecology

## 2022-02-03 VITALS — BP 109/73 | HR 72 | Wt 210.2 lb

## 2022-02-03 DIAGNOSIS — Z3480 Encounter for supervision of other normal pregnancy, unspecified trimester: Secondary | ICD-10-CM

## 2022-02-03 DIAGNOSIS — O99210 Obesity complicating pregnancy, unspecified trimester: Secondary | ICD-10-CM

## 2022-02-03 DIAGNOSIS — Z3A38 38 weeks gestation of pregnancy: Secondary | ICD-10-CM

## 2022-02-03 NOTE — Progress Notes (Signed)
   LOW-RISK PREGNANCY OFFICE VISIT Patient name: Melissa Foster MRN [id number]  Date of birth: Sep 24, 1995 Chief Complaint:   Routine Prenatal Visit  History of Present Illness:   Melissa Foster is a 26 y.o. G25P1011 female at [redacted]w[redacted]d with an Estimated Date of Delivery: 02/15/22 being seen today for ongoing management of a low-risk pregnancy.  Today she reports occasional contractions. Contractions: Irritability. Vag. Bleeding: None.  Movement: Present. denies leaking of fluid. Review of Systems:   Pertinent items are noted in HPI Denies abnormal vaginal discharge w/ itching/odor/irritation, headaches, visual changes, shortness of breath, chest pain, abdominal pain, severe nausea/vomiting, or problems with urination or bowel movements unless otherwise stated above. Pertinent History Reviewed:  Reviewed past medical,surgical, social, obstetrical and family history.  Reviewed problem list, medications and allergies. Physical Assessment:   Vitals:   02/03/22 1015  BP: 109/73  Pulse: 72  Weight: 210 lb 3.2 oz (95.3 kg)  Body mass index is 34.98 kg/m.        Physical Examination:   General appearance: Well appearing, and in no distress  Mental status: Alert, oriented to person, place, and time  Skin: Warm & dry  Cardiovascular: Normal heart rate noted  Respiratory: Normal respiratory effort, no distress  Abdomen: Soft, gravid, nontender  Pelvic: Cervical exam deferred         Extremities: Edema: Trace  Fetal Status: Fetal Heart Rate (bpm): 146 Fundal Height: 38 cm Movement: Present    No results found for this or any previous visit (from the past 24 hour(s)).  Assessment & Plan:  1) Low-risk pregnancy G3P1011 at [redacted]w[redacted]d with an Estimated Date of Delivery: 02/15/22   2) Supervision of other normal pregnancy, antepartum  3) Maternal obesity affecting pregnancy, antepartum - Taking daily bASA  4) [redacted] weeks gestation of pregnancy    Meds: No orders of the defined  types were placed in this encounter.  Labs/procedures today: none  Plan:  Continue routine obstetrical care   Reviewed: Term labor symptoms and general obstetric precautions including but not limited to vaginal bleeding, contractions, leaking of fluid and fetal movement were reviewed in detail with the patient.  All questions were answered. Has home bp cuff. Check bp weekly, let us know if >140/90.   Follow-up: Return in about 1 week (around 02/10/2022) for Return OB visit.  No orders of the defined types were placed in this encounter.  Raelyn Mora MSN, CNM 02/03/2022 10:36 AM

## 2022-02-03 NOTE — Progress Notes (Signed)
Pt presents for ROB visit with no concerns at this time.  

## 2022-02-05 ENCOUNTER — Encounter (HOSPITAL_COMMUNITY): Payer: Self-pay | Admitting: Obstetrics & Gynecology

## 2022-02-05 ENCOUNTER — Other Ambulatory Visit: Payer: Self-pay

## 2022-02-05 ENCOUNTER — Inpatient Hospital Stay (HOSPITAL_COMMUNITY)
Admission: AD | Admit: 2022-02-05 | Discharge: 2022-02-07 | DRG: 807 | Disposition: A | Discharge status: Home / Self Care | Payer: BC Managed Care – PPO | Attending: Obstetrics & Gynecology | Admitting: Obstetrics & Gynecology

## 2022-02-05 DIAGNOSIS — O26893 Other specified pregnancy related conditions, third trimester: Secondary | ICD-10-CM | POA: Diagnosis present

## 2022-02-05 DIAGNOSIS — Z30017 Encounter for initial prescription of implantable subdermal contraceptive: Secondary | ICD-10-CM | POA: Diagnosis not present

## 2022-02-05 DIAGNOSIS — Z3480 Encounter for supervision of other normal pregnancy, unspecified trimester: Principal | ICD-10-CM

## 2022-02-05 DIAGNOSIS — O99210 Obesity complicating pregnancy, unspecified trimester: Secondary | ICD-10-CM

## 2022-02-05 DIAGNOSIS — Z3A38 38 weeks gestation of pregnancy: Secondary | ICD-10-CM | POA: Diagnosis not present

## 2022-02-05 DIAGNOSIS — O99214 Obesity complicating childbirth: Secondary | ICD-10-CM | POA: Diagnosis present

## 2022-02-05 LAB — CBC
HCT: 40.4 % (ref 36.0–46.0)
Hemoglobin: 14.2 g/dL (ref 12.0–15.0)
MCH: 30.9 pg (ref 26.0–34.0)
MCHC: 35.1 g/dL (ref 30.0–36.0)
MCV: 87.8 fL (ref 80.0–100.0)
Platelets: 208 10*3/uL (ref 150–400)
RBC: 4.6 MIL/uL (ref 3.87–5.11)
RDW: 13.3 % (ref 11.5–15.5)
WBC: 10.6 10*3/uL — ABNORMAL HIGH (ref 4.0–10.5)
nRBC: 0 % (ref 0.0–0.2)

## 2022-02-05 LAB — TYPE AND SCREEN
ABO/RH(D): O POS
Antibody Screen: NEGATIVE

## 2022-02-05 LAB — RPR: RPR Ser Ql: NONREACTIVE

## 2022-02-05 MED ORDER — OXYTOCIN-SODIUM CHLORIDE 30-0.9 UT/500ML-% IV SOLN
2.5000 [IU]/h | INTRAVENOUS | Status: DC
  Filled 2022-02-05: qty 500

## 2022-02-05 MED ORDER — MEASLES, MUMPS & RUBELLA VAC IJ SOLR: 0.5000 mL | Freq: Once | INTRAMUSCULAR | Status: DC

## 2022-02-05 MED ORDER — SENNOSIDES-DOCUSATE SODIUM 8.6-50 MG PO TABS
2.0000 | ORAL_TABLET | ORAL | Status: DC
  Administered 2022-02-05 – 2022-02-06 (×2): 2 via ORAL
  Filled 2022-02-05 (×2): qty 2

## 2022-02-05 MED ORDER — ONDANSETRON HCL 4 MG/2ML IJ SOLN: 4.0000 mg | Freq: Four times a day (QID) | INTRAMUSCULAR | Status: DC | PRN

## 2022-02-05 MED ORDER — COCONUT OIL OIL
1.0000 | TOPICAL_OIL | Status: DC | PRN
  Administered 2022-02-07: 1 via TOPICAL

## 2022-02-05 MED ORDER — ONDANSETRON HCL 4 MG PO TABS: 4.0000 mg | ORAL_TABLET | ORAL | Status: DC | PRN

## 2022-02-05 MED ORDER — IBUPROFEN 600 MG PO TABS
600.0000 mg | ORAL_TABLET | Freq: Four times a day (QID) | ORAL | Status: DC
  Administered 2022-02-05 – 2022-02-07 (×8): 600 mg via ORAL
  Filled 2022-02-05 (×8): qty 1

## 2022-02-05 MED ORDER — DIPHENHYDRAMINE HCL 25 MG PO CAPS: 25.0000 mg | ORAL_CAPSULE | Freq: Four times a day (QID) | ORAL | Status: DC | PRN

## 2022-02-05 MED ORDER — OXYCODONE-ACETAMINOPHEN 5-325 MG PO TABS: 2.0000 | ORAL_TABLET | ORAL | Status: DC | PRN

## 2022-02-05 MED ORDER — OXYTOCIN BOLUS FROM INFUSION
333.0000 mL | Freq: Once | INTRAVENOUS | Status: AC
  Administered 2022-02-05: 333 mL via INTRAVENOUS

## 2022-02-05 MED ORDER — OXYCODONE-ACETAMINOPHEN 5-325 MG PO TABS: 1.0000 | ORAL_TABLET | ORAL | Status: DC | PRN

## 2022-02-05 MED ORDER — BENZOCAINE-MENTHOL 20-0.5 % EX AERO
1.0000 | INHALATION_SPRAY | CUTANEOUS | Status: DC | PRN
  Administered 2022-02-05: 1 via TOPICAL
  Filled 2022-02-05: qty 56

## 2022-02-05 MED ORDER — SIMETHICONE 80 MG PO CHEW: 80.0000 mg | CHEWABLE_TABLET | ORAL | Status: DC | PRN

## 2022-02-05 MED ORDER — ONDANSETRON HCL 4 MG/2ML IJ SOLN: 4.0000 mg | INTRAMUSCULAR | Status: DC | PRN

## 2022-02-05 MED ORDER — TETANUS-DIPHTH-ACELL PERTUSSIS 5-2.5-18.5 LF-MCG/0.5 IM SUSY: 0.5000 mL | PREFILLED_SYRINGE | Freq: Once | INTRAMUSCULAR | Status: DC

## 2022-02-05 MED ORDER — WITCH HAZEL-GLYCERIN EX PADS: 1.0000 | MEDICATED_PAD | CUTANEOUS | Status: DC | PRN

## 2022-02-05 MED ORDER — ACETAMINOPHEN 325 MG PO TABS
650.0000 mg | ORAL_TABLET | ORAL | Status: DC | PRN
  Administered 2022-02-05 – 2022-02-06 (×2): 650 mg via ORAL
  Filled 2022-02-05 (×2): qty 2

## 2022-02-05 MED ORDER — LIDOCAINE HCL (PF) 1 % IJ SOLN
30.0000 mL | INTRAMUSCULAR | Status: AC | PRN
Start: 2022-02-05 — End: 2022-02-05
  Administered 2022-02-05: 30 mL via SUBCUTANEOUS
  Filled 2022-02-05: qty 30

## 2022-02-05 MED ORDER — LACTATED RINGERS IV SOLN: 500.0000 mL | INTRAVENOUS | Status: DC | PRN

## 2022-02-05 MED ORDER — ACETAMINOPHEN 325 MG PO TABS: 650.0000 mg | ORAL_TABLET | ORAL | Status: DC | PRN

## 2022-02-05 MED ORDER — DIBUCAINE (PERIANAL) 1 % EX OINT: 1.0000 | TOPICAL_OINTMENT | CUTANEOUS | Status: DC | PRN

## 2022-02-05 MED ORDER — PRENATAL MULTIVITAMIN CH
1.0000 | ORAL_TABLET | Freq: Every day | ORAL | Status: DC
  Administered 2022-02-06 – 2022-02-07 (×2): 1 via ORAL
  Filled 2022-02-05 (×2): qty 1

## 2022-02-05 MED ORDER — LACTATED RINGERS IV SOLN: INTRAVENOUS | Status: DC

## 2022-02-05 MED ORDER — SOD CITRATE-CITRIC ACID 500-334 MG/5ML PO SOLN: 30.0000 mL | ORAL | Status: DC | PRN

## 2022-02-05 MED ORDER — FENTANYL CITRATE (PF) 100 MCG/2ML IJ SOLN
50.0000 ug | INTRAMUSCULAR | Status: DC | PRN
  Administered 2022-02-05: 100 ug via INTRAVENOUS
  Filled 2022-02-05 (×2): qty 2

## 2022-02-05 NOTE — Discharge Summary (Signed)
Postpartum Discharge Summary  Date of Service updated-yes     Patient Name: Melissa Foster DOB: 1995/08/27 MRN: [id number]  Date of admission: 02/05/2022 Delivery date:02/05/2022  Delivering provider: Julianne Handler  Date of discharge: 02/07/2022  Admitting diagnosis: Normal labor [O80, Z37.9] Intrauterine pregnancy: [redacted]w[redacted]d    Secondary diagnosis:  Principal Problem:   Normal labor Active Problems:   SVD (spontaneous vaginal delivery)  Additional problems: none    Discharge diagnosis: Term Pregnancy Delivered                                              Post partum procedures: Nexplanon Augmentation: N/A Complications: None  Hospital course: Onset of Labor With Vaginal Delivery      26y.o. yo G3P1011 at 39w4das admitted in Active Labor on 02/05/2022. Patient had an uncomplicated labor course as follows:  Membrane Rupture Time/Date: 12:17 PM ,02/05/2022   Delivery Method:Vaginal, Spontaneous  Episiotomy: None  Lacerations:  2nd degree  Patient had an uncomplicated postpartum course.  She is ambulating, tolerating a regular diet, passing flatus, and urinating well. Patient is discharged home in stable condition on 02/07/22.  Newborn Data: Birth date:02/05/2022  Birth time:1:15 PM  Gender:Female  Living status:Living  Apgars:9 ,9  Weight:3345 g   Magnesium Sulfate received: No BMZ received: No Rhophylac:N/A MMR:N/A T-DaP:Given prenatally Flu: Yes Transfusion:No  Physical exam  Vitals:   02/06/22 1412 02/06/22 2020 02/07/22 0024 02/07/22 0511  BP: (!) 105/58 (!) 103/46 112/67 107/66  Pulse: 70 72 60 60  Resp: _0 Temp: 97.7 F (36.5 C) (!) 97.5 F (36.4 C)  98.3 F (36.8 C)  TempSrc: Oral Oral  Oral  SpO2: 98% 99% 100% 99%  Weight:      Height:       General: alert, cooperative, and no distress Lochia: appropriate Uterine Fundus: firm Incision: N/A DVT Evaluation: No evidence of DVT seen on physical exam. Negative Homan's  sign. No cords or calf tenderness. No significant calf/ankle edema. Labs: Lab Results  Component Value Date   WBC 10.6 (H) 02/05/2022   HGB 14.2 02/05/2022   HCT 40.4 02/05/2022   MCV 87.8 02/05/2022   PLT 208 02/05/2022      Latest Ref Rng & Units 08/24/2021   12:49 PM  CMP  Glucose 70 - 99 mg/dL 94   BUN 6 - 20 mg/dL 7   Creatinine 0.44 - 1.00 mg/dL 0.44   Sodium 135 - 145 mmol/L 134   Potassium 3.5 - 5.1 mmol/L 3.5   Chloride 98 - 111 mmol/L 105   CO2 22 - 32 mmol/L 22   Calcium 8.9 - 10.3 mg/dL 8.6   Total Protein 6.5 - 8.1 g/dL 6.1   Total Bilirubin 0.3 - 1.2 mg/dL 0.6   Alkaline Phos 38 - 126 U/L 39   AST 15 - 41 U/L 22   ALT 0 - 44 U/L 13    Edinburgh Score:    02/07/2022    5:13 AM  Edinburgh Postnatal Depression Scale Screening Tool  I have been able to laugh and see the funny side of things. 0  I have looked forward with enjoyment to things. 0  I have blamed myself unnecessarily when things went wrong. 1  I have been anxious or worried for no good reason. 0  I have felt scared  or panicky for no good reason. 0  Things have been getting on top of me. 0  I have been so unhappy that I have had difficulty sleeping. 0  I have felt sad or miserable. 0  I have been so unhappy that I have been crying. 1  The thought of harming myself has occurred to me. 0  Edinburgh Postnatal Depression Scale Total 2     After visit meds:  Allergies as of 02/07/2022   No Known Allergies      Medication List     STOP taking these medications    aspirin EC 81 MG tablet       TAKE these medications    ibuprofen 600 MG tablet Commonly known as: ADVIL Take 1 tablet (600 mg total) by mouth every 6 (six) hours.   Vitafol Ultra 29-0.6-0.4-200 MG Caps Take 1 tablet by mouth daily.         Discharge home in stable condition Infant Feeding: Breast Infant Disposition:home with mother Discharge instruction: per After Visit Summary and Postpartum booklet. Activity:  Advance as tolerated. Pelvic rest for 6 weeks.  Diet: routine diet Future Appointments: Future Appointments  Date Time Provider [place]  02/10/2022  1:30 PM Rasch, Artist Pais, NP [place] None   Follow up Visit:   Please schedule this patient for a In person postpartum visit in 6 weeks with the following provider: Any provider. Additional Postpartum F/U: none   Low risk pregnancy complicated by:  none Delivery mode:  Vaginal, Spontaneous  Anticipated Birth Control:  PP Nexplanon placed   02/07/2022 Julianne Handler, CNM

## 2022-02-05 NOTE — H&P (Signed)
OBSTETRIC ADMISSION HISTORY AND PHYSICAL  Melissa Foster is a 26 y.o. female G3P1011 with IUP at [redacted]w[redacted]d presenting for labor. She reports +FMs. No LOF, VB, blurry vision, headaches, peripheral edema, or RUQ pain. She plans on breastfeeding. She requests Nexplanon for birth control.  Dating: By 19w [place] --->  Estimated Date of Delivery: 02/15/22  Sono:    @[redacted]w[redacted]d , normal anatomy, cephalic presentation, 1208g, , EFW 2'11   Prenatal History/Complications: -obesity  Past Medical History: Past Medical History:  Diagnosis Date   Medical history non-contributory     Past Surgical History: Past Surgical History:  Procedure Laterality Date   NO PAST SURGERIES      Obstetrical History: OB History     Gravida  3   Para  1   Term  1   Preterm      AB  1   Living  1      SAB  1   IAB      Ectopic      Multiple  0   Live Births  1           Social History: Social History   Socioeconomic History   Marital status: Single    Spouse name: Not on file   Number of children: Not on file   Years of education: Not on file   Highest education level: Not on file  Occupational History   Not on file  Tobacco Use   Smoking status: Never   Smokeless tobacco: Never  Vaping Use   Vaping Use: Never used  Substance and Sexual Activity   Alcohol use: Not Currently    Comment: occasionally prior to pregnancy   Drug use: No   Sexual activity: Yes    Partners: Male    Birth control/protection: None  Other Topics Concern   Not on file  Social History Narrative   Not on file   Social Determinants of Health   Financial Resource Strain: Not on file  Food Insecurity: Not on file  Transportation Needs: Not on file  Physical Activity: Not on file  Stress: Not on file  Social Connections: Not on file    Family History: Family History  Problem Relation Age of Onset   Healthy Mother    Hypertension Father     Allergies: No Known  Allergies  Medications Prior to Admission  Medication Sig Dispense Refill Last Dose   aspirin EC 81 MG tablet Take 1 tablet (81 mg total) by mouth daily. Take after 12 weeks for prevention of preeclampsia later in pregnancy 300 tablet 2 02/04/2022   Prenat-Fe Poly-Methfol-FA-DHA (VITAFOL ULTRA) 29-0.6-0.4-200 MG CAPS Take 1 tablet by mouth daily. 30 capsule 12 02/04/2022     Review of Systems:  All systems reviewed and negative except as stated in HPI  PE: Blood pressure 134/74, pulse 80, temperature 98.8 F (37.1 C), temperature source Oral, resp. rate 18, height 5\' 4"  (1.626 m), weight 95.3 kg, last menstrual period 04/11/2021, SpO2 99 %. General appearance: alert, cooperative, and no distress Lungs: regular rate and effort Heart: regular rate  Abdomen: soft, non-tender Extremities: Homans sign is negative, no sign of DVT Presentation: cephalic EFM: 135 bpm, mod variability, + accels, no decels Toco: 2-4 Dilation: 5 Effacement (%): 80 Station: -2 Exam by:: 04/13/2021 RN EFW by Le[place]olds 7.25 lbs  Prenatal labs: ABO, Rh: O/Positive/-- (12/05 1534) Antibody: Negative (12/05 1534) Rubella: 2.29 (12/05 1534) RPR: Non Reactive (03/27 1040)  HBsAg: Negative (12/05 1534)  HIV:  Non Reactive (03/27 1040)  GBS: Negative/-- (05/31 1448)  2 hr GTT nml  Prenatal Transfer Tool  Maternal Diabetes: No Genetic Screening: Normal Maternal Ultrasounds/Referrals: Normal Fetal Ultrasounds or other Referrals:  None Maternal Substance Abuse:  No Significant Maternal Medications:  None Significant Maternal Lab Results: Group B Strep negative  Results for orders placed or performed during the hospital encounter of 02/05/22 (from the past 24 hour(s))  CBC   Collection Time: 02/05/22  8:17 AM  Result Value Ref Range   WBC 10.6 (H) 4.0 - 10.5 K/uL   RBC 4.60 3.87 - 5.11 MIL/uL   Hemoglobin 14.2 12.0 - 15.0 g/dL   HCT 29.9 37.1 - 69.6 %   MCV 87.8 80.0 - 100.0 fL   MCH 30.9 26.0 - 34.0  pg   MCHC 35.1 30.0 - 36.0 g/dL   RDW 78.9 38.1 - 01.7 %   Platelets 208 150 - 400 K/uL   nRBC 0.0 0.0 - 0.2 %    Patient Active Problem List   Diagnosis Date Noted   Normal labor 02/05/2022   Maternal obesity affecting pregnancy, antepartum 07/26/2021   Supervision of other normal pregnancy, antepartum 06/16/2021    Assessment: Melissa Foster is a 26 y.o. G3P1011 at [redacted]w[redacted]d here for labor  1. Labor: active 2. FWB: Cat I 3. Pain: analgesia/anesthesia prn 4. GBS: neg   Plan: Admit to LD Expectant mngt Anticipate SVD  Donette Larry, CNM  02/05/2022, 9:15 AM

## 2022-02-05 NOTE — Lactation Note (Signed)
This note was copied from a baby's chart. Lactation Consultation Note  Patient Name: Melissa Foster [id number] Date: 02/05/2022 Reason for consult: Initial assessment;Early term 37-38.6wks;1st time breastfeeding Age:25 hours   P2 mother whose infant is now 45 hours old.  This is an early term infant at 38+4 weeks.  Mother's current feeding preference is breast/formula.  She did not breast feed her first child.  Baby was swaddled and asleep in mother's arms when I arrived.  Reviewed breast feeding basics.  Mother has recently attempted to feed, stating she even had formula at bedside in case she needed to use it.  Encouraged her to refrain from formula, if possible and allow the staff members to assist with latching if desired.  Discussed supply and demand and using hand expressed drops to awaken and entice baby to latch.  Reviewed tummy size, voids/stools and encouraged lots of STS.  Mother verbalized understanding.  She will call as desired for latch assistance.   Maternal Data Has patient been taught Hand Expression?: Yes Does the patient have breastfeeding experience prior to this delivery?: No  Feeding Mother's Current Feeding Choice: Breast Milk and Formula  LATCH Score                    Lactation Tools Discussed/Used    Interventions Interventions: Breast feeding basics reviewed;Education;LC Services brochure  Discharge Pump: Hands Free  Consult Status Consult Status: Follow-up Date: 02/06/22 Follow-up type: In-patient    Sharie Amorin R Thomasene Dubow 02/05/2022, 6:05 PM

## 2022-02-05 NOTE — MAU Note (Signed)
Melissa Foster is a 26 y.o. at [redacted]w[redacted]d here in MAU reporting: ctxs every minutes that began this morning @ 0400.  Denies LOF or VB.  Endorses +FM.  Onset of complaint: 0400 Pain score: 7/10 Vitals:   02/05/22 0717  BP: 114/68  Pulse: 74  Resp: 18  Temp: 98.1 F (36.7 C)  SpO2: 98%     FHT:125 bpm Lab orders placed from triage:   None

## 2022-02-06 NOTE — Progress Notes (Signed)
Post Partum Day #1 Subjective: no complaints, up ad lib, and voiding  Objective: Blood pressure 101/64, pulse 68, temperature 97.9 F (36.6 C), temperature source Oral, resp. rate 16, height 5\' 4"  (1.626 m), weight 95.3 kg, last menstrual period 04/11/2021, SpO2 100 %, unknown if currently breastfeeding.  Physical Exam:  General: cooperative and no distress Lochia: appropriate Uterine Fundus: firm Incision: NA DVT Evaluation: No evidence of DVT seen on physical exam.  Recent Labs    02/05/22 0817  HGB 14.2  HCT 40.4    Assessment/Plan: Plan for discharge tomorrow -patient would like Nexplanon placed today  LOS: 1 day   02/07/22 Gracen Ringwald 02/06/2022, 5:40 AM

## 2022-02-06 NOTE — Lactation Note (Signed)
This note was copied from a baby's chart. Lactation Consultation Note  Patient Name: Melissa Foster [id number] Date: 02/06/2022 Reason for consult: Follow-up assessment;Difficult latch;Infant weight loss;Early term 37-38.6wks;Breastfeeding assistance (2.39% WL) Age:25 hours  P2, Early Term, Female Infant, 2.39% WL  LC entered the room and mom was holding baby. Per mom breastfeeding was going ok, but it was painful when baby latched. She says that she does not have a lot of milk. Mom states that it feels like baby sucks very hard and her uterus cramps when baby breastfeeds. Mom says that she last attempted to latch baby at 35 or 10 am.   LC spoke with mom about milk production within the first few days, supply and demand, and the importance of latching baby frequently.   LC also encouraged mom to call for latch assistance when she is ready to feed baby. LC let mom know that she should feel a tug when latching baby, but she should not feel pain.   Mom states that she will call for latch assistance.   Feeding Nipple Type: Slow - flow  Lactation Tools Discussed/Used    Interventions Interventions: Breast feeding basics reviewed;Education  Discharge    Consult Status Consult Status: Follow-up Date: 02/06/22 Follow-up type: In-patient    Delene Loll 02/06/2022, 3:35 PM

## 2022-02-06 NOTE — Lactation Note (Signed)
This note was copied from a baby's chart. Lactation Consultation Note Mom had baby on the breast assisted by RN. Mom stated it was hurting. Mom kept pulling back on breast tissue worried about baby not being able to breathe. Suggested mom not pull back, makes latch shallow d/t pulling breast tissue out of mouth. Mom stated OK. Mom had stiff bra on w/under wire. Encouraged to remove bra and not wear bra w/under wire and explained why. Assisted mom in taking it off. When LC attempted to latch baby baby was sleeping. Baby had emesis. Mom stated the baby has been spitting up a lot. Newborn behavior and feeding habits reviewed.  Patient Name: Melissa Foster [id number] Date: 02/06/2022 Reason for consult: Mother's request;Primapara;Difficult latch;Early term 37-38.6wks Age:36 hours  Maternal Data    Feeding    LATCH Score Latch: Grasps breast easily, tongue down, lips flanged, rhythmical sucking.  Audible Swallowing: None  Type of Nipple: Everted at rest and after stimulation (short shaft)  Comfort (Breast/Nipple): Soft / non-tender  Hold (Positioning): Assistance needed to correctly position infant at breast and maintain latch.  LATCH Score: 7   Lactation Tools Discussed/Used Tools: Shells;Pump Breast pump type: Manual Pump Education: Setup, frequency, and cleaning Reason for Pumping: pre-pumping Pumping frequency: pre-pumping  Interventions Interventions: Breast feeding basics reviewed;Assisted with latch;Skin to skin;Breast massage;Hand express;Pre-pump if needed;Breast compression;Adjust position;Support pillows;Position options;Shells;Hand pump  Discharge    Consult Status Consult Status: Follow-up Date: 02/06/22 Follow-up type: In-patient    Charyl Dancer 02/06/2022, 1:44 AM

## 2022-02-07 DIAGNOSIS — Z30017 Encounter for initial prescription of implantable subdermal contraceptive: Secondary | ICD-10-CM

## 2022-02-07 MED ORDER — ETONOGESTREL 68 MG ~~LOC~~ IMPL
68.0000 mg | DRUG_IMPLANT | Freq: Once | SUBCUTANEOUS | Status: AC
  Administered 2022-02-07: 68 mg via SUBCUTANEOUS
  Filled 2022-02-07: qty 1

## 2022-02-07 MED ORDER — LIDOCAINE HCL 1 % IJ SOLN
0.0000 mL | Freq: Once | INTRAMUSCULAR | Status: AC | PRN
  Administered 2022-02-07: 20 mL via INTRADERMAL
  Filled 2022-02-07: qty 20

## 2022-02-07 MED ORDER — IBUPROFEN 600 MG PO TABS: 600.0000 mg | ORAL_TABLET | Freq: Four times a day (QID) | ORAL | 0 refills | Status: AC

## 2022-02-07 NOTE — Procedures (Signed)
GYNECOLOGY PROCEDURE NOTE  Melissa Foster is a 26 y.o. [id number] requesting Nexplanon insertion. No gynecologic concerns.  Nexplanon Insertion Procedure Patient identified, informed consent performed, consent signed. Patient does understand that irregular bleeding is a very common side effect of this medication. She was advised to have backup contraception for one week after placement. Appropriate time out taken. Patient's Left arm was prepped and draped in the usual sterile fashion. The insertion area was measured and marked. Patient was prepped with alcohol swab and then injected with 3 ml of 1% lidocaine. The area was then prepped with betadine. Nexplanon removed from packaging and device confirmed present within needle, then inserted full length of needle and withdrawn per handbook instructions. Nexplanon was able to palpated in the patient's arm; patient palpated the insert herself. There was minimal blood loss. Patient insertion site covered with steri strip, guaze, and a pressure bandage to reduce any bruising. The patient tolerated the procedure well and was given post procedure instructions.    Exp 2024/01/09 Lot# : [id number]  Plan to replace in 2026.   Adlyn Fife Danella Deis) Suzie Portela, MSN, CNM  [facility]  02/07/22 12:11 PM

## 2022-02-07 NOTE — Lactation Note (Signed)
This note was copied from a baby's chart. Lactation Consultation Note  Patient Name: Melissa Foster [id number] Date: 02/07/2022 Reason for consult: Follow-up assessment Age:25 hours  P2, Mother is not latching as often due to soreness. No trauma noted.  Provided mother with coconut oil and encouraged mother to call for latch assistance. Discussed deep latch. Reviewed engorgement care and monitoring voids/stools. Recommend breastfeeding before formula to help mother establish her milk supply.  Feeding Mother's Current Feeding Choice: Breast Milk and Formula   Interventions Interventions: Coconut oil;Education  Discharge Discharge Education: Engorgement and breast care;Warning signs for feeding baby  Consult Status Consult Status: Complete Date: 02/07/22    Melissa Foster [facility] 02/07/2022, 10:27 AM

## 2022-02-08 ENCOUNTER — Telehealth: Payer: Self-pay

## 2022-02-10 ENCOUNTER — Encounter: Payer: BC Managed Care – PPO | Admitting: Obstetrics and Gynecology

## 2022-02-11 ENCOUNTER — Telehealth: Payer: Self-pay

## 2022-02-11 NOTE — Telephone Encounter (Signed)
LVMM to call Office, FMLA paperwork is completed. there is a $15.00 before it can be faxed, we close at 12:noon today.

## 2022-02-15 ENCOUNTER — Telehealth (HOSPITAL_COMMUNITY): Payer: Self-pay | Admitting: *Deleted

## 2022-03-24 ENCOUNTER — Encounter: Payer: Self-pay | Admitting: Student

## 2022-03-24 ENCOUNTER — Ambulatory Visit (INDEPENDENT_AMBULATORY_CARE_PROVIDER_SITE_OTHER): Payer: BC Managed Care – PPO | Admitting: Student

## 2022-03-24 NOTE — Progress Notes (Signed)
Post Partum Visit Note  Melissa Budreau Rodriges is a 26 y.o. [phone] female who presents for a postpartum visit. She is 6 weeks postpartum following a normal spontaneous vaginal delivery.  I have fully reviewed the prenatal and intrapartum course. The delivery was at [redacted]w[redacted]d. Anesthesia: local. Postpartum course has been going well, no complaints from the patient. Baby is doing well, currently has an URI. Baby is feeding by bottle - gerber formula . Bleeding no bleeding. Bowel function is normal. Bladder function is normal. Patient is not sexually active. Contraception method is Nexplanon. Postpartum depression screening: negative.   The pregnancy intention screening data noted above was reviewed. Potential methods of contraception were discussed. The patient elected to proceed with No data recorded.   Edinburgh Postnatal Depression Scale - 03/24/22 0918       Edinburgh Postnatal Depression Scale:  In the Past 7 Days   I have been able to laugh and see the funny side of things. 0    I have looked forward with enjoyment to things. 0    I have blamed myself unnecessarily when things went wrong. 0    I have been anxious or worried for no good reason. 0    I have felt scared or panicky for no good reason. 0    Things have been getting on top of me. 0    I have been so unhappy that I have had difficulty sleeping. 0    I have felt sad or miserable. 0    I have been so unhappy that I have been crying. 0    The thought of harming myself has occurred to me. 0    Edinburgh Postnatal Depression Scale Total 0             Health Maintenance Due  Topic Date Due   COVID-19 Vaccine (1) Never done   HPV VACCINES (1 - 2-dose series) Never done   INFLUENZA VACCINE  03/22/2022    The following portions of the patient's history were reviewed and updated as appropriate: allergies, current medications, past family history, past medical history, past social history, past surgical history, and  problem list.  Review of Systems Constitutional: negative for chills, fatigue, fevers, and weight loss Respiratory: negative for cough, dyspnea on exertion, and wheezing Cardiovascular: negative for chest pain and chest pressure/discomfort Gastrointestinal: negative for abdominal pain, change in bowel habits, constipation, diarrhea, and vomiting Genitourinary:negative for dysuria, frequency, and urinary incontinence Integument/breast: negative for breast tenderness, nipple discharge, and skin color change Musculoskeletal:negative for muscle weakness, myalgias, and swelling Behavioral/Psych: negative for anxiety, depression, and mood swings  Objective:  BP 107/72   Pulse 65   Ht 5\' 4"  (1.626 m)   Wt 187 lb 8 oz (85 kg)   LMP 03/22/2022 (Approximate)   Breastfeeding No   BMI 32.18 kg/m    General:  alert, cooperative, appears stated age, and no distress   Breasts:  normal  Lungs: clear to auscultation bilaterally  Heart:  regular rate and rhythm, S1, S2 normal, no murmur, click, rub or gallop  Abdomen: soft, non-tender; bowel sounds normal; no masses,  no organomegaly   Wound N/A  GU exam:  not indicated       Assessment:    There are no diagnoses linked to this encounter.  Normal postpartum exam.   Plan:   Essential components of care per ACOG recommendations:  1.  Mood and well being: Patient with negative depression screening today. Reviewed local resources for  support.  - Patient tobacco use? No.   - hx of drug use? No.    2. Infant care and feeding:  -Patient currently breastmilk feeding? No.  -Social determinants of health (SDOH) reviewed in EPIC. No concerns.  3. Sexuality, contraception and birth spacing - Patient does not want a pregnancy in the next year.  Desired family size is 2 children.  - Reviewed reproductive life planning. Reviewed contraceptive methods based on pt preferences and effectiveness.  Patient desired Hormonal Implant today.   - Discussed  birth spacing of 18 months  4. Sleep and fatigue -Encouraged family/partner/community support of 4 hrs of uninterrupted sleep to help with mood and fatigue  5. Physical Recovery  - Discussed patients delivery and complications. She describes her labor as good. - Patient had a Vaginal, no problems at delivery. Patient had a 2nd degree laceration. Perineal healing reviewed. Patient expressed understanding - Patient has urinary incontinence? No. - Patient is safe to resume physical and sexual activity  6.  Health Maintenance - HM due items addressed Yes - Last pap smear  Diagnosis  Date Value Ref Range Status  07/26/2021      - Negative for intraepithelial lesion or malignancy (NILM)   Pap smear not done at today's visit.  -Breast Cancer screening indicated? No.   7. Chronic Disease/Pregnancy Condition follow up: None  - PCP follow up  Corlis Hove, NP [facility], [facility]

## 2022-06-28 NOTE — Telephone Encounter (Signed)
  CALLED TO CHECK STATUS OF FMLA PAPERWORK
